# Patient Record
Sex: Female | Born: 1970
Health system: Southern US, Community
[De-identification: ages and names within clinical notes are randomized; demographics above are authoritative.]

## PROBLEM LIST (undated history)

## (undated) DIAGNOSIS — T7840XA Allergy, unspecified, initial encounter: Secondary | ICD-10-CM

## (undated) DIAGNOSIS — D649 Anemia, unspecified: Secondary | ICD-10-CM

## (undated) DIAGNOSIS — E282 Polycystic ovarian syndrome: Secondary | ICD-10-CM

## (undated) DIAGNOSIS — N189 Chronic kidney disease, unspecified: Secondary | ICD-10-CM

## (undated) DIAGNOSIS — I10 Essential (primary) hypertension: Secondary | ICD-10-CM

## (undated) DIAGNOSIS — Z8709 Personal history of other diseases of the respiratory system: Secondary | ICD-10-CM

## (undated) DIAGNOSIS — C801 Malignant (primary) neoplasm, unspecified: Secondary | ICD-10-CM

## (undated) DIAGNOSIS — E785 Hyperlipidemia, unspecified: Secondary | ICD-10-CM

## (undated) DIAGNOSIS — R7303 Prediabetes: Secondary | ICD-10-CM

## (undated) HISTORY — PX: NASAL SINUS SURGERY: SHX719

## (undated) HISTORY — DX: Essential (primary) hypertension: I10

## (undated) HISTORY — PX: REFRACTIVE SURGERY: SHX103

## (undated) HISTORY — PX: CERVICAL CERCLAGE: SHX1329

## (undated) HISTORY — PX: COLONOSCOPY: SHX174

## (undated) HISTORY — PX: DILATION AND CURETTAGE OF UTERUS: SHX78

## (undated) HISTORY — PX: UTERINE ARTERY EMBOLIZATION: SHX2629

## (undated) HISTORY — DX: Hyperlipidemia, unspecified: E78.5

## (undated) HISTORY — PX: OTHER SURGICAL HISTORY: SHX169

## (undated) HISTORY — DX: Polycystic ovarian syndrome: E28.2

---

## 1998-04-05 ENCOUNTER — Other Ambulatory Visit: Admission: RE | Admit: 1998-04-05 | Discharge: 1998-04-05 | Payer: Self-pay | Admitting: Obstetrics and Gynecology

## 1998-12-03 ENCOUNTER — Other Ambulatory Visit: Admission: RE | Admit: 1998-12-03 | Discharge: 1998-12-03 | Payer: Self-pay | Admitting: Obstetrics and Gynecology

## 1998-12-21 ENCOUNTER — Ambulatory Visit (HOSPITAL_COMMUNITY): Admission: RE | Admit: 1998-12-21 | Discharge: 1998-12-21 | Payer: Self-pay | Admitting: Obstetrics and Gynecology

## 1999-10-10 ENCOUNTER — Other Ambulatory Visit: Admission: RE | Admit: 1999-10-10 | Discharge: 1999-10-10 | Payer: Self-pay | Admitting: Obstetrics and Gynecology

## 1999-12-29 ENCOUNTER — Ambulatory Visit (HOSPITAL_COMMUNITY): Admission: RE | Admit: 1999-12-29 | Discharge: 1999-12-29 | Payer: Self-pay | Admitting: *Deleted

## 2000-03-17 ENCOUNTER — Inpatient Hospital Stay (HOSPITAL_COMMUNITY): Admission: AD | Admit: 2000-03-17 | Discharge: 2000-03-17 | Payer: Self-pay | Admitting: Obstetrics and Gynecology

## 2000-04-18 ENCOUNTER — Inpatient Hospital Stay (HOSPITAL_COMMUNITY): Admission: EM | Admit: 2000-04-18 | Discharge: 2000-04-18 | Payer: Self-pay | Admitting: Obstetrics and Gynecology

## 2000-04-18 ENCOUNTER — Encounter: Payer: Self-pay | Admitting: Obstetrics and Gynecology

## 2000-04-30 ENCOUNTER — Inpatient Hospital Stay (HOSPITAL_COMMUNITY): Admission: AD | Admit: 2000-04-30 | Discharge: 2000-05-02 | Payer: Self-pay | Admitting: Obstetrics and Gynecology

## 2000-05-03 ENCOUNTER — Encounter: Admission: RE | Admit: 2000-05-03 | Discharge: 2000-07-24 | Payer: Self-pay | Admitting: Obstetrics and Gynecology

## 2000-05-29 ENCOUNTER — Other Ambulatory Visit: Admission: RE | Admit: 2000-05-29 | Discharge: 2000-05-29 | Payer: Self-pay | Admitting: Obstetrics and Gynecology

## 2001-03-06 ENCOUNTER — Other Ambulatory Visit: Admission: RE | Admit: 2001-03-06 | Discharge: 2001-03-06 | Payer: Self-pay | Admitting: Obstetrics and Gynecology

## 2001-04-18 ENCOUNTER — Encounter: Admission: RE | Admit: 2001-04-18 | Discharge: 2001-07-17 | Payer: Self-pay | Admitting: Obstetrics and Gynecology

## 2001-09-07 ENCOUNTER — Inpatient Hospital Stay (HOSPITAL_COMMUNITY): Admission: AD | Admit: 2001-09-07 | Discharge: 2001-09-09 | Payer: Self-pay | Admitting: Obstetrics and Gynecology

## 2001-10-10 ENCOUNTER — Other Ambulatory Visit: Admission: RE | Admit: 2001-10-10 | Discharge: 2001-10-10 | Payer: Self-pay | Admitting: Obstetrics and Gynecology

## 2002-09-25 ENCOUNTER — Other Ambulatory Visit: Admission: RE | Admit: 2002-09-25 | Discharge: 2002-09-25 | Payer: Self-pay | Admitting: Obstetrics and Gynecology

## 2002-10-11 ENCOUNTER — Emergency Department (HOSPITAL_COMMUNITY): Admission: EM | Admit: 2002-10-11 | Discharge: 2002-10-11 | Payer: Self-pay | Admitting: *Deleted

## 2002-10-11 ENCOUNTER — Encounter: Payer: Self-pay | Admitting: Emergency Medicine

## 2003-11-25 ENCOUNTER — Other Ambulatory Visit: Admission: RE | Admit: 2003-11-25 | Discharge: 2003-11-25 | Payer: Self-pay | Admitting: Obstetrics and Gynecology

## 2005-01-06 ENCOUNTER — Ambulatory Visit: Payer: Self-pay | Admitting: Internal Medicine

## 2005-01-11 ENCOUNTER — Encounter: Admission: RE | Admit: 2005-01-11 | Discharge: 2005-04-11 | Payer: Self-pay | Admitting: Internal Medicine

## 2005-04-10 ENCOUNTER — Ambulatory Visit: Payer: Self-pay | Admitting: *Deleted

## 2005-06-07 ENCOUNTER — Encounter: Admission: RE | Admit: 2005-06-07 | Discharge: 2005-06-07 | Payer: Self-pay | Admitting: Family Medicine

## 2005-08-24 ENCOUNTER — Emergency Department (HOSPITAL_COMMUNITY): Admission: EM | Admit: 2005-08-24 | Discharge: 2005-08-24 | Payer: Self-pay | Admitting: Emergency Medicine

## 2005-08-24 ENCOUNTER — Ambulatory Visit: Payer: Self-pay | Admitting: Cardiology

## 2005-08-31 ENCOUNTER — Ambulatory Visit: Payer: Self-pay

## 2005-12-14 ENCOUNTER — Ambulatory Visit: Payer: Self-pay | Admitting: Internal Medicine

## 2007-01-22 ENCOUNTER — Encounter (INDEPENDENT_AMBULATORY_CARE_PROVIDER_SITE_OTHER): Payer: Self-pay | Admitting: Specialist

## 2007-01-22 ENCOUNTER — Ambulatory Visit (HOSPITAL_COMMUNITY): Admission: RE | Admit: 2007-01-22 | Discharge: 2007-01-22 | Payer: Self-pay | Admitting: Obstetrics and Gynecology

## 2007-04-04 ENCOUNTER — Encounter: Admission: RE | Admit: 2007-04-04 | Discharge: 2007-04-04 | Payer: Self-pay | Admitting: Obstetrics and Gynecology

## 2007-04-14 ENCOUNTER — Encounter: Admission: RE | Admit: 2007-04-14 | Discharge: 2007-04-14 | Payer: Self-pay | Admitting: Interventional Radiology

## 2007-05-14 ENCOUNTER — Ambulatory Visit (HOSPITAL_COMMUNITY): Admission: RE | Admit: 2007-05-14 | Discharge: 2007-05-14 | Payer: Self-pay | Admitting: Interventional Radiology

## 2007-05-16 ENCOUNTER — Ambulatory Visit (HOSPITAL_COMMUNITY): Admission: RE | Admit: 2007-05-16 | Discharge: 2007-05-17 | Payer: Self-pay | Admitting: Interventional Radiology

## 2007-05-29 ENCOUNTER — Encounter: Admission: RE | Admit: 2007-05-29 | Discharge: 2007-05-29 | Payer: Self-pay | Admitting: Interventional Radiology

## 2007-11-29 ENCOUNTER — Ambulatory Visit: Payer: Self-pay | Admitting: Oncology

## 2008-01-10 ENCOUNTER — Ambulatory Visit: Payer: Self-pay | Admitting: Oncology

## 2008-01-29 ENCOUNTER — Encounter: Admission: RE | Admit: 2008-01-29 | Discharge: 2008-01-29 | Payer: Self-pay | Admitting: Interventional Radiology

## 2011-05-12 NOTE — Consult Note (Signed)
[name], [name]                   ACCOUNT NO.:  [id number]   MEDICAL RECORD NO.:  [id number]          PATIENT TYPE:  EMS   LOCATION:  MINO                         FACILITY:  [facility]   PHYSICIAN:  Olga Millers, M.D. LHCDATE OF BIRTH:  04-29-1971   DATE OF CONSULTATION:  08/24/2005  DATE OF DISCHARGE:                                   CONSULTATION   [name] is a 40 year old female, a patient of Dr. Tenny Craw, who has a history of  hyperlipidemia, and now I am asked to evaluate for chest pain.  Of note, the  patient typically does not have dyspnea on exertion, orthopnea, PND, pedal  edema or exertional chest pain.  Yesterday at approximately 2 p.m. she  developed point tenderness in the left chest area.  It was described as a  sharp pain.  The pain did not radiate.  It did increase with turning her  head and with moving around, by her report.  There was no associated  shortness of breath, nausea or vomiting or diaphoresis.  The pain has been  essentially continuous since then, and she presented to the emergency room  this afternoon.  We were asked to further evaluate.   She has no known drug allergies.   SOCIAL HISTORY:  She does not smoke, nor does she consume alcohol.  She  denies any cocaine use.   FAMILY HISTORY:  Positive for coronary artery disease in her father, who has  had stents placed in his 85s.   PAST MEDICAL HISTORY:  Significant for hyperlipidemia, but there is no  diabetes mellitus or hypertension.  She has had a previous C-section.   Her present medications include:  1.  Vytorin 10/10 mg daily.  2.  Zyrtec.  3.  Birth control pills.  4.  She also takes herbal supplements.   REVIEW OF SYSTEMS:  She denies any headaches or fevers or chills.  There is  no productive cough or hemoptysis.  There is no dysphagia, odynophagia,  melena or hematochezia.  There is no dysuria or hematuria.  There is no rash  or seizure activity.  There is no orthopnea, PND or pedal edema.   The  remaining systems are negative.  Of note, she has not had any recent travel  or leg injury.   PHYSICAL EXAMINATION:  VITAL SIGNS:  Her blood pressure is 118/53 and her  pulse is 66.  She is afebrile.  GENERAL:  She is well-developed and well-nourished, in no acute distress.  Her skin is warm and dry.  She does not appear to be depressed, and there is  no peripheral clubbing.  HEENT:  Unremarkable with normal eyelids.  NECK:  Supple, within normal limits bilaterally, and there are no bruits  noted.  There is no jugular venous distention and no thyromegaly noted.  CHEST:  Clear to auscultation with normal expansion.  CARDIOVASCULAR:  Regular rate and rhythm, normal S1 and S2.  There are no  murmurs, rubs or gallops noted.  She is tender to palpation over the left  chest area and states that her pain  is reproduced with those measures.  ABDOMEN:  Not tender or distended, positive bowel sounds noted, no  hepatosplenomegaly and no masses appreciated.  There is no abdominal bruit.  She has 2+ femoral pulses bilaterally and no bruits.  EXTREMITIES:  No edema, and I can palpate no cords.  She has a negative  Homans bilaterally.  She has 2+ dorsalis pedis pulses bilaterally.  NEUROLOGIC:  Grossly intact.   Her electrocardiogram shows a normal sinus rhythm at a rate of 64.  The axis  is normal.  There are nonspecific ST changes.  Her urinalysis is negative.  Her point of care markers are negative x2.  Her potassium is 3.5 with a BUN  and creatinine of 9 and 0.7.  Her hemoglobin and hematocrit are 11.8 and  34.7, respectively.  Her white blood cell count is 5.9.   DIAGNOSES:  1.  Atypical chest pain.  2.  Hyperlipidemia.   PLAN:  [name] has presented with chest pain most consistent with  musculoskeletal pain.  It has been continuous for greater than 12 hours and  is reproduced on palpation.  Given the duration and the fact that her  enzymes are negative, then she is essentially ruled  out for myocardial  infarction.  I think it is therefore appropriate to discharge the patient,  and we will plan an outpatient Myoview.  She will need to try nonsteroidals  for her musculoskeletal pain, and she will follow up with Dr. Tenny Craw.           ______________________________  Olga Millers, M.D. [facility]     BC/MEDQ  D:  08/24/2005  T:  08/25/2005  Job:  [id number]

## 2011-05-12 NOTE — Op Note (Signed)
[facility]  Patient:    [name]                           MRN: [id number] Proc. Date: 12/29/99 Adm. Date:  [id number] Attending:  Ardeen Fillers                           Operative Report  PREOPERATIVE DIAGNOSIS:       Cervical incompetence.  Intrauterine pregnancy at 68+ weeks gestational age.  POSTOPERATIVE DIAGNOSIS:      Cervical incompetence.  Intrauterine pregnancy at 66+ weeks gestational age.  OPERATION:                    Modified Shirodkar cervical cerclage.  SURGEON:                      Sung Amabile. Roslyn Smiling, M.D.  ASSISTANT:                    Genia Del, M.D.  ANESTHESIA:                   Spinal.  ESTIMATED BLOOD LOSS:         Less than 10 cc.  TUBES AND DRAINS:             None.  COMPLICATIONS:                None.  FINDINGS:                     Preoperative cervical examination closed, 1 to 2 m long, soft, anterior.  Postoperative cervical examination closed, 2 to 3 cm long. Stitch knot at 1 oclock.  SPECIMENS:                    None.  INDICATIONS:                  A 40 year old woman, gravida 2, para 0-0-1-0, admitted at 57+ weeks gestational age for cervical cerclage because of progressive cervical change consistent with cervical incompetence which was first noted on ultrasound one week ago.  DESCRIPTION OF PROCEDURE:     After the establishment of spinal anesthesia, the  patient was placed in the left uterine displacement position.  The fetal heart tones were documented.  The patient was then placed in the dorsal lithotomy position.  The perineum and vagina were prepped with Betadine solution.  She was draped.  The bladder was evacuated with straight catheterization.  A weighted speculum was inserted into the vagina.  The cervix was reprepped with Betadine solution.  Narrow Deavers were used to gently retract the vaginal walls.  The cervix was grasped with small ring forceps.  Using the modified  Shirodkar technique, Mersilene band was placed in a pursestring fashion.  0 Prolene was tied into the Mersilene knot (which was at 1 oclock).  After the procedure, the cervix was closed and 2 to 3 cm long.  The bladder was evacuated again and urine was clear.  Rectal examination was performed and was normal.  She was returned to the supine position and transported to the recovery room in satisfactory condition. DD:  12/29/99 TD:  12/30/99 Job: [id number]

## 2011-05-12 NOTE — Op Note (Signed)
NAMECHAUNCEY, [name]                   ACCOUNT NO.:  [id number]   MEDICAL RECORD NO.:  [id number]          PATIENT TYPE:  AMB   LOCATION:  SDC                           FACILITY:  [facility]   PHYSICIAN:  Maxie Better, M.D.DATE OF BIRTH:  [birth year]   DATE OF PROCEDURE:  01/22/2007  DATE OF DISCHARGE:                               OPERATIVE REPORT   PREOPERATIVE DIAGNOSIS:  Left complex ovarian cyst, menorrhagia, uterine  fibroids.   PROCEDURE:  Diagnostic hysteroscopy, hysteroscopic resection of  submucosal fibroid, dilation and curettage, diagnostic laparoscopy, left  ovarian cystectomy.   POSTOPERATIVE DIAGNOSIS:  Left ovarian dermoid cyst, menorrhagia,  submucosal fibroid.   ANESTHESIA:  General, paracervical block.   SURGEON:  Maxie Better, M.D.   ASSISTANT:  None.   DESCRIPTION OF PROCEDURE:  Under adequate general anesthesia, the  patient was placed in the dorsal lithotomy position.  She was sterilely  prepped and draped in the usual fashion.  The bladder was catheterized  for a small amount of urine.  Examination under anesthesia revealed a  retroverted uterus.  No adnexal masses could be appreciated.  A bivalve  speculum was placed in the vagina.  A single tooth tenaculum was placed  on the anterior lip of the cervix. 1% Nesacaine was injected  paracervical at 3 and 9 o'clock. The cervix, which was parous, was  serially dilated up to number 21 Pratt dilator.  A diagnostic  hysteroscope was introduced. Both tubal ostia could be seen; however,  the distension of the cavity was limited by the parous cervix and,  therefore, the diagnostic hysteroscope was removed.  The cervix was then  further dilated and a resectoscope with a double loop was introduced  into the uterine cavity without incident.  On inspection of the uterine  cavity, there was noted on the right lateral sidewall a large submucosal  fibroid.  No other lesions could be noted.  Using a double loop, the  fibroid was resected and small pieces removed. Concern for continued  protuberance of the fibroid into the endometrial cavity resulted in the  procedure being terminated after what was felt to be adequate submucosal  resection. The resectoscope was removed.  The cavity was then curetted  and specimen labeled endometrial curettings and fibroid resections were  both passed off for pathology.  The bivalve acorn cannula was introduced  into the uterine cavity and attached to the tenaculum for manipulation  of the uterus.  The bivalve speculum was removed.   Attention was then turned to the abdomen.  An infraumbilical small  vertical incision was made after 0.25% Marcaine was injected at the  site.  A Veress needle was introduced.  Opening pressure of 9 was noted.  2.5 liters of CO2 was insufflated after normal saline had been used to  test the placement of the Veress needle.  The Veress needle was then  removed.  A 10 mm disposable trocar was introduced into the abdomen  without incident and the lighted video laparoscope was then introduced.  On initial inspection of the pelvis, there was blood in the abdomen  thought to be probably secondary to a comminution of the patient was  previously bleeding and from the procedure that had just been performed.  A suprapubic incision was then made, a 5 mm port was placed through that  suprapubic incision under direct visualization.  The probe was then  utilized to inspect the upper abdomen and pelvis.  The normal liver edge  was noted.  An elongated but otherwise normal appendix was noted.  The  uterus was noted to be bulky. Both fallopian tubes were normal.  The  right ovary was enlarged consistent with a polycystic type ovarian  ovary. The posterior cul-de-sac had some blood in the posterior aspect  of the cul-de-sac.  The left ovary was normal in size with a small area  of yellowish appearance that suggested cystic in nature, though possibly  to be a  corpus luteal cyst.  The left ovary was deemed stabilized and  the decision was made to try to open the overlying yellow area.   An initial attempt to through the suprapubic port was not feasible, and  therefore, a left lower quadrant incision was then made and a 5 mm port  was placed under direct visualization. Using a hot scissors, an incision  was made overlying the yellow at which time sebaceous material extruded  consistent with a dermoid cyst. Using a grasper, the cyst was opened  further and the sebaceous material was suctioned out and it was then  noted that there was another cystic mass beneath that and this was then  enucleated and ultimately removed.  The cyst wall was then also removed  off of the ovary and cauterization was used for hemostasis.  The left  ovarian dermoid cyst was then taken out through the infraumbilical port  and sent to pathology as a left ovarian dermoid cyst. The abdomen was  then copiously irrigated and suctioned posteriorly.  There was some  hemosiderin material suggestive of endometriosis in the posterior cul-de-  sac.  When good irrigation was noted and no further sebaceous material  was seen.  The procedure was felt to have been complete at which time  the lower ports were then removed, the abdomen was deflated, and the  infraumbilical port was removed under direct visualization. The  infraumbilical incision was closed with a deep layer 0 Vicryl figure-of-  eight suture.  The other incision sites and superficial areas were  closed with 4-0 Vicryl.  The instruments in the vagina were removed.  The cervix was inspected for any bleeders.  Specimens labeled  endometrial curetting, fibroid resection, and left ovarian dermoid cyst  with cyst wall was sent to pathology.  Estimated blood loss was minimal.  Complication was none.  The patient tolerated the procedure well and was  transferred to recovery in stable condition.     Maxie Better, M.D.   Electronically Signed     [place]/MEDQ  D:  01/22/2007  T:  01/22/2007  Job:  [id number]

## 2011-05-12 NOTE — H&P (Signed)
[facility]  Patient:    [name], [name] Visit Number: [id number] MRN: [id number]          Service Type: OBS Location: 910B [id number] 01 Attending Physician:  Esmeralda Arthur Dictated by:   Silverio Lay, M.D. Admit Date:  09/07/2001                           History and Physical  REASON FOR ADMISSION:         Intrauterine pregnancy, at 37 weeks and 4 days with spontaneous rupture of membranes.  HISTORY OF PRESENT ILLNESS:   This is a 40 year old, married, African-American woman, gravida 3, para 1, abortus 1, with a due date by ultrasound of September 24, 2001, being admitted at 37 weeks and 4 days reporting spontaneous rupture of membranes at around 0030 hours this morning.  Fluid was clear. She reported no bleeding, reported good fetal activity and denied any symptoms of pregnancy-induced hypertension. She came in contracting every 3-5 minutes.  PRENATAL COURSE:              Reveals blood type A positive, sickle cell negative RPR nonreactive, rubella-immune, HBsAg negative.  HIV negative. Pap smear within normal limits. Gonorrhea negative. Chlamydia negative.  First trimester ultrasound with a cervical length of 3.75 cm and due date changed to October 1.  A 16-week AFP within normal limits.  A 20-week ultrasound with posterior placenta, velamentous insertion of cord, normal anatomy survey, and cervical length at 4.01 cm.  A 28-week glucose tolerance test within normal limits, repeated at 33 weeks was elevated and needed a three-hour glucose tolerance test which was normal.  She has been on relative bed rest at home for preterm cervical change. A 34-week ultrasound with a estimated fetal weight at the 93rd percentile and amniotic fluid index within normal limits. A 38-week ultrasound with an estimated fetal week at 77th percentile.  Upper limit normal amniotic fluid index, and 35-week group B strep negative. Prenatal course was otherwise  uneventful.  PAST MEDICAL HISTORY:         Allergy to [facility], December 1999. Miscarriage requiring D&C, no complication.  May 2001 vaginal delivery by vacuum extraction at 38 weeks of a female infant weighing 7 pounds 15 ounces. Required a cerclage during that pregnancy.  FAMILY HISTORY:               Father with chronic hypertension and colorectal cancer at age 68.  SOCIAL HISTORY:               Married, non smoker, is a Art gallery manager.  PHYSICAL EXAMINATION:  VITAL SIGNS:                  Vital signs are normal.  HEENT:                        Negative.  LUNGS:                        Clear.  HEART:                        Normal.  ABDOMEN:                      Gravid and nontender, size greater than dates with mild polyhydramnios.  PELVIC:  Vaginal examination by admitting nurse, 3-4 cm, 80% effaced, vertex -1, positive fern.  Fetal heart rate trace is reactive.  ASSESSMENT:                   Intrauterine pregnancy, at 37 weeks and 4 days, with spontaneous rupture of membranes in early labor.  PLAN:                         The patient will be admitted to labor and delivery.  Pain management as usual. Spontaneous vaginal delivery expected. Dictated by:   Silverio Lay, M.D. Attending Physician:  Esmeralda Arthur DD:  09/07/01 TD:  09/07/01 Job: [id number]

## 2014-02-27 ENCOUNTER — Encounter: Payer: 59 | Attending: Obstetrics and Gynecology | Admitting: *Deleted

## 2014-02-27 ENCOUNTER — Encounter: Payer: Self-pay | Admitting: *Deleted

## 2014-02-27 VITALS — Wt 165.0 lb

## 2014-02-27 DIAGNOSIS — R7309 Other abnormal glucose: Secondary | ICD-10-CM | POA: Insufficient documentation

## 2014-02-27 DIAGNOSIS — Z713 Dietary counseling and surveillance: Secondary | ICD-10-CM | POA: Insufficient documentation

## 2014-02-27 DIAGNOSIS — E663 Overweight: Secondary | ICD-10-CM

## 2014-02-27 DIAGNOSIS — R739 Hyperglycemia, unspecified: Secondary | ICD-10-CM

## 2014-02-27 DIAGNOSIS — E282 Polycystic ovarian syndrome: Secondary | ICD-10-CM

## 2014-02-27 NOTE — Patient Instructions (Addendum)
Breakfast: Have protein with carbohydrates (boiled eggs, yogurt with fruit) Visualize the MyPlate method when making meals and choosing foods when you eat out Keep up the physical activity!

## 2014-02-27 NOTE — Progress Notes (Signed)
  Medical Nutrition Therapy:  Appt start time: 0800 end time:  0900.   Assessment:  Primary concerns today: [name] is here for nutrition counseling pertaining to weight loss and pre-diabetes. [name] has been having problems with her blood pressure being high. Her current weight is around  165 lb. Her doctor would like her to lose 30 lbs. [name] has previously tried diets like cutting out breads. [name] would like to "lose her stomach" but otherwise she is not concerned with her weight number. When she was in college, she was around 100 lbs. She states that she started gaining weight after her first pregnancy. Her highest weight has been 180 when she was first pregnant. She would like her weight to be around 150 lbs. She has a history of PCOS and she says that she craves sweets. [name] lives at home with her husband and two kids. She does the grocery  Shopping and the cooking. The family will eat together most of the time at the dining table with distractions (tv, phones). The family will eat out fairly often (examples: Chipotle, Pita Delite), [name] cooks 2-3 times a week. [name] states that she is a medium paced eater. She works at [employer] with a 8-5, M-F schedule. Her last HgbA1c was 5.5.  Preferred Learning Style:  No preference indicated   Learning Readiness:  Change in progress  MEDICATIONS: see list   DIETARY INTAKE:  Usual eating pattern includes 3 meals and 1-2 snacks per day.  24-hr recall:  B ( AM): banana, greek yogurt, apple, water  Snk ( AM): none  L ( PM): eats at work, salmon with broccoli and mac and cheese, chicken sandwich or [place] with fries, pork BBQ with hush puppies & slaw, water Snk ( PM): almonds D ( PM): leftovers, salads and a meat (chicken, [place], fish), water Snk ( PM): cookies, goldfish Beverages: water, sweet tea sometimes   Usual physical activity: 3 days a week [facility] [employer] , currently going on the treadmill (30  minutes)  Estimated energy needs: 2000 calories 225 g carbohydrates 150 g protein 56 g fat   Nutritional Diagnosis:  [place]-2.1 Inpaired nutrition utilization As related to carbohydrate metabolism .  As evidenced by PCOS.    Intervention:  Nutrition counseling provided. Discussed the MyPlate method, making half the plate non-starchy vegetables. Discussed carbohydrates and increasing fiber intake. Encouraged her to continue her physical activity regimen.  Teaching Method Utilized: Visual Auditory  Handouts given during visit include:  none  Barriers to learning/adherence to lifestyle change: none  Demonstrated degree of understanding via:  Teach Back   Monitoring/Evaluation:  Dietary intake, exercise, A1c, and body weight prn.

## 2017-01-29 DIAGNOSIS — Z1231 Encounter for screening mammogram for malignant neoplasm of breast: Secondary | ICD-10-CM | POA: Diagnosis not present

## 2017-02-21 DIAGNOSIS — L602 Onychogryphosis: Secondary | ICD-10-CM | POA: Diagnosis not present

## 2017-02-21 DIAGNOSIS — B351 Tinea unguium: Secondary | ICD-10-CM | POA: Diagnosis not present

## 2017-02-26 ENCOUNTER — Ambulatory Visit (HOSPITAL_COMMUNITY)
Admission: EM | Admit: 2017-02-26 | Discharge: 2017-02-26 | Disposition: A | Discharge status: Home / Self Care | Payer: 59 | Attending: Family Medicine | Admitting: Family Medicine

## 2017-02-26 ENCOUNTER — Emergency Department (HOSPITAL_COMMUNITY)
Admission: EM | Admit: 2017-02-26 | Discharge: 2017-02-26 | Disposition: A | Discharge status: Home / Self Care | Payer: 59 | Attending: Emergency Medicine | Admitting: Emergency Medicine

## 2017-02-26 ENCOUNTER — Encounter (HOSPITAL_COMMUNITY): Payer: Self-pay | Admitting: Family Medicine

## 2017-02-26 ENCOUNTER — Encounter (HOSPITAL_COMMUNITY): Payer: Self-pay | Admitting: Emergency Medicine

## 2017-02-26 ENCOUNTER — Emergency Department (HOSPITAL_COMMUNITY): Payer: 59

## 2017-02-26 DIAGNOSIS — I10 Essential (primary) hypertension: Secondary | ICD-10-CM | POA: Insufficient documentation

## 2017-02-26 DIAGNOSIS — R109 Unspecified abdominal pain: Secondary | ICD-10-CM | POA: Diagnosis not present

## 2017-02-26 DIAGNOSIS — R1013 Epigastric pain: Secondary | ICD-10-CM

## 2017-02-26 DIAGNOSIS — K8020 Calculus of gallbladder without cholecystitis without obstruction: Secondary | ICD-10-CM | POA: Diagnosis not present

## 2017-02-26 DIAGNOSIS — Z7984 Long term (current) use of oral hypoglycemic drugs: Secondary | ICD-10-CM | POA: Diagnosis not present

## 2017-02-26 DIAGNOSIS — K829 Disease of gallbladder, unspecified: Secondary | ICD-10-CM | POA: Diagnosis not present

## 2017-02-26 LAB — POC URINE PREG, ED: Preg Test, Ur: NEGATIVE

## 2017-02-26 LAB — URINALYSIS, ROUTINE W REFLEX MICROSCOPIC
Bilirubin Urine: NEGATIVE
Glucose, UA: NEGATIVE mg/dL
Hgb urine dipstick: NEGATIVE
Ketones, ur: NEGATIVE mg/dL
Leukocytes, UA: NEGATIVE
Nitrite: NEGATIVE
Protein, ur: NEGATIVE mg/dL
Specific Gravity, Urine: 1.019 (ref 1.005–1.030)
pH: 6 (ref 5.0–8.0)

## 2017-02-26 LAB — COMPREHENSIVE METABOLIC PANEL
ALT: 264 U/L — ABNORMAL HIGH (ref 14–54)
AST: 305 U/L — ABNORMAL HIGH (ref 15–41)
Albumin: 3.8 g/dL (ref 3.5–5.0)
Alkaline Phosphatase: 62 U/L (ref 38–126)
Anion gap: 10 (ref 5–15)
BUN: 14 mg/dL (ref 6–20)
CO2: 26 mmol/L (ref 22–32)
Calcium: 9.6 mg/dL (ref 8.9–10.3)
Chloride: 100 mmol/L — ABNORMAL LOW (ref 101–111)
Creatinine, Ser: 0.88 mg/dL (ref 0.44–1.00)
GFR calc Af Amer: >60 mL/min (ref >60)
GFR calc non Af Amer: >60 mL/min (ref >60)
Glucose, Bld: 98 mg/dL (ref 65–99)
Potassium: 3.2 mmol/L — ABNORMAL LOW (ref 3.5–5.1)
Sodium: 136 mmol/L (ref 135–145)
Total Bilirubin: 0.9 mg/dL (ref 0.3–1.2)
Total Protein: 6.8 g/dL (ref 6.5–8.1)

## 2017-02-26 LAB — CBC
HCT: 36 % (ref 36.0–46.0)
Hemoglobin: 11.6 g/dL — ABNORMAL LOW (ref 12.0–15.0)
MCH: 26.9 pg (ref 26.0–34.0)
MCHC: 32.2 g/dL (ref 30.0–36.0)
MCV: 83.5 fL (ref 78.0–100.0)
Platelets: 275 10*3/uL (ref 150–400)
RBC: 4.31 MIL/uL (ref 3.87–5.11)
RDW: 13.4 % (ref 11.5–15.5)
WBC: 6.1 10*3/uL (ref 4.0–10.5)

## 2017-02-26 LAB — LIPASE, BLOOD: Lipase: 33 U/L (ref 11–51)

## 2017-02-26 MED ORDER — POTASSIUM CHLORIDE CRYS ER 20 MEQ PO TBCR
40.0000 meq | EXTENDED_RELEASE_TABLET | Freq: Once | ORAL | Status: AC
  Administered 2017-02-26: 40 meq via ORAL
  Filled 2017-02-26: qty 2

## 2017-02-26 NOTE — ED Triage Notes (Signed)
Pt sts upper abd pain into shoulders worse after eating starting yesterday

## 2017-02-26 NOTE — ED Provider Notes (Signed)
Plains of epigastric pain onset yesterday pain is triggered by eating. She had chicken wings today at 1 PM which caused a flareup of pain. On exam she appears in no distress abdomen is obese, tender at right upper quadrant without guarding. Negative Murphy sign no right lower quadrant pain   Julia Dakin, MD 02/26/17 [id number]

## 2017-02-26 NOTE — ED Provider Notes (Signed)
[place]    CSN: [id number] Arrival date & time: 02/26/17  1649     History   Chief Complaint Chief Complaint  Patient presents with  . Abdominal Pain    HPI [name] is a 46 y.o. female.   Pt here for epigastric pain that started last night and radiation to shoulder. sts worse after eating a meal and relieved with ibuprofen. The pain lasted several hours last night but subsided for bedtime. When she got this morning pain was gone and she had something to eat the pain came back after eating. She's had very little to eat this afternoon other than a couple saltines at 3:00.  Patient's had no nausea vomiting or diarrhea. She's had no fever. She's never had this pain before. The pain is now gone, but was present in the epigastrium.      Past Medical History:  Diagnosis Date  . Hyperlipidemia   . Hypertension   . PCOS (polycystic ovarian syndrome)     There are no active problems to display for this patient.   History reviewed. No pertinent surgical history.  OB History    No data available       Home Medications    Prior to Admission medications   Medication Sig Start Date End Date Taking? Authorizing Provider  hydrochlorothiazide (HYDRODIURIL) 12.5 MG tablet Take 12.5 mg by mouth daily.   Yes Historical Provider, MD  fluticasone (FLONASE) 50 MCG/ACT nasal spray Place 2 sprays into both nostrils daily.    Historical Provider, MD  metFORMIN (GLUCOPHAGE) 500 MG tablet Take 500 mg by mouth 2 (two) times daily with a meal.    Historical Provider, MD    Family History Family History  Problem Relation Age of Onset  . Diabetes Father   . Hypertension Father   . Cancer Father   . Heart disease Father   . Cancer Other   . Sleep apnea Other     Social History Social History  Substance Use Topics  . Smoking status: Never Smoker  . Smokeless tobacco: Never Used  . Alcohol use Not on file     Allergies   Patient has no known  allergies.   Review of Systems Review of Systems  Constitutional: Negative.   HENT: Negative.   Gastrointestinal: Positive for abdominal pain.  Neurological: Negative.      Physical Exam Triage Vital Signs ED Triage Vitals  Enc Vitals Group     BP 02/26/17 1709 135/84     Pulse Rate 02/26/17 1709 78     Resp 02/26/17 1709 18     Temp 02/26/17 1709 98.2 F (36.8 C)     Temp src --      SpO2 02/26/17 1709 97 %     Weight --      Height --      Head Circumference --      Peak Flow --      Pain Score 02/26/17 1711 2     Pain Loc --      Pain Edu? --      Excl. in [place]? --    No data found.   Updated Vital Signs BP 135/84   Pulse 78   Temp 98.2 F (36.8 C)   Resp 18   LMP 02/12/2017   SpO2 97%   Visual Acuity Right Eye Distance:   Left Eye Distance:   Bilateral Distance:    Right Eye Near:   Left Eye Near:  Bilateral Near:     Physical Exam  Constitutional: She is oriented to person, place, and time. She appears well-developed and well-nourished.  HENT:  Head: Normocephalic.  Right Ear: External ear normal.  Left Ear: External ear normal.  Mouth/Throat: Oropharynx is clear and moist.  Eyes: Conjunctivae and EOM are normal. Pupils are equal, round, and reactive to light.  Neck: Normal range of motion. Neck supple.  Cardiovascular: Normal rate, regular rhythm and normal heart sounds.   Pulmonary/Chest: Breath sounds normal.  Abdominal: Soft. Bowel sounds are normal. She exhibits no distension. There is no tenderness. There is no rebound and no guarding.  Musculoskeletal: Normal range of motion.  Neurological: She is alert and oriented to person, place, and time.  Skin: Skin is warm and dry.  Nursing note and vitals reviewed.    UC Treatments / Results  Labs (all labs ordered are listed, but only abnormal results are displayed) Labs Reviewed - No data to display  EKG  EKG Interpretation None       Radiology No results  found.  Procedures Procedures (including critical care time)  Medications Ordered in UC Medications - No data to display   Initial Impression / Assessment and Plan / UC Course  I have reviewed the triage vital signs and the nursing notes.  Pertinent labs & imaging results that were available during my care of the patient were reviewed by me and considered in my medical decision making (see chart for details).     Final Clinical Impressions(s) / UC Diagnoses   Final diagnoses:  Acute epigastric pain    New Prescriptions New Prescriptions   No medications on file  Transferred to emergency department for further evaluation   Robyn Haber, MD 02/26/17 1728

## 2017-02-26 NOTE — ED Notes (Signed)
Pt returned from US

## 2017-02-26 NOTE — ED Notes (Signed)
Pt verbalized discharge instructions. Unable to sign.

## 2017-02-26 NOTE — ED Provider Notes (Signed)
[facility] DEPT Provider Note   CSN: [id number] Arrival date & time: 02/26/17  1736     History   Chief Complaint Chief Complaint  Patient presents with  . Abdominal Pain    HPI [name] is a 46 y.o. female.  HPI Arrives from urgent care due to concerns of abdominal pain that started yesterday. Patient states that she was eating and suddenly developed severe epigastric pain that radiated to her shoulder. She took some ibuprofen and felt better and then this morning was feeling a little bit of abdominal pain so she didn't eat much. But by the midday she was feeling better and ate some chicken wings which cause significant epigastric and right upper quadrant pain that radiated to her shoulder. She has not been vomiting. No diarrhea or constipation. No history of any abdominal surgeries. She is not pregnant to her knowledge. She is otherwise healthy except for hypertension and PCOS. She states the pain is severe but is significantly better controlled currently.  Past Medical History:  Diagnosis Date  . Hyperlipidemia   . Hypertension   . PCOS (polycystic ovarian syndrome)     There are no active problems to display for this patient.   History reviewed. No pertinent surgical history.  OB History    No data available       Home Medications    Prior to Admission medications   Medication Sig Start Date End Date Taking? Authorizing Provider  fluticasone (FLONASE) 50 MCG/ACT nasal spray Place 2 sprays into both nostrils daily.    Historical Provider, MD  hydrochlorothiazide (HYDRODIURIL) 12.5 MG tablet Take 12.5 mg by mouth daily.    Historical Provider, MD  metFORMIN (GLUCOPHAGE) 500 MG tablet Take 500 mg by mouth 2 (two) times daily with a meal.    Historical Provider, MD    Family History Family History  Problem Relation Age of Onset  . Diabetes Father   . Hypertension Father   . Cancer Father   . Heart disease Father   . Cancer Other   . Sleep apnea Other      Social History Social History  Substance Use Topics  . Smoking status: Never Smoker  . Smokeless tobacco: Never Used  . Alcohol use Not on file     Allergies   Patient has no known allergies.   Review of Systems Review of Systems  Constitutional: Negative for fever.  Allergic/Immunologic: Negative for immunocompromised state.  All other systems reviewed and are negative.    Physical Exam Updated Vital Signs BP 144/98 (BP Location: Right Arm)   Pulse 94   Temp 98.8 F (37.1 C) (Oral)   Resp 18   LMP 02/12/2017   SpO2 99%   Physical Exam  Constitutional: She is oriented to person, place, and time. She appears well-developed and well-nourished. No distress.  HENT:  Head: Normocephalic and atraumatic.  Eyes: Conjunctivae are normal. Right eye exhibits no discharge. Left eye exhibits no discharge.  Neck: Normal range of motion. Neck supple.  Cardiovascular: Normal rate and regular rhythm.   Pulmonary/Chest: Effort normal and breath sounds normal. No respiratory distress.  Abdominal: Soft. Bowel sounds are normal. She exhibits no distension and no mass. There is tenderness (epigastric, mild). There is no rebound and no guarding. No hernia.  Neg murphys sign  Musculoskeletal: She exhibits no edema.  Neurological: She is alert and oriented to person, place, and time.  Skin: Skin is warm. No rash noted.  Psychiatric: She has a normal mood  and affect.  Nursing note and vitals reviewed.    ED Treatments / Results  Labs (all labs ordered are listed, but only abnormal results are displayed) Labs Reviewed  COMPREHENSIVE METABOLIC PANEL - Abnormal; Notable for the following:       Result Value   Potassium 3.2 (*)    Chloride 100 (*)    AST 305 (*)    ALT 264 (*)    All other components within normal limits  CBC - Abnormal; Notable for the following:    Hemoglobin 11.6 (*)    All other components within normal limits  URINALYSIS, ROUTINE W REFLEX MICROSCOPIC -  Abnormal; Notable for the following:    APPearance HAZY (*)    All other components within normal limits  LIPASE, BLOOD  POC URINE PREG, ED    EKG  EKG Interpretation  Date/Time:  Monday February 26 2017 17:47:53 EST Ventricular Rate:  70 PR Interval:  134 QRS Duration: 70 QT Interval:  378 QTC Calculation: 408 R Axis:   37 Text Interpretation:  Normal sinus rhythm Low voltage QRS Cannot rule out Anterior infarct , age undetermined Abnormal ECG Confirmed by Jeneen Rinks  MD, [place] ([id number]) on 02/26/2017 5:56:21 PM       Radiology US Abdomen Limited Ruq  Result Date: 02/26/2017 CLINICAL DATA:  Abdominal pain EXAM: US ABDOMEN LIMITED - RIGHT UPPER QUADRANT COMPARISON:  None. FINDINGS: Gallbladder: Gallbladder is contracted. There are shadowing stones in the gallbladder lumen measuring up to 1.8 cm. Negative sonographic Murphy's. Wall thickness upper normal at 3 mm Common bile duct: Diameter: Normal at 4 mm Liver: No focal lesion identified. Within normal limits in parenchymal echogenicity. IMPRESSION: Contracted gallbladder containing shadowing stones. Borderline wall thickness is likely related to gallbladder contraction. No biliary dilatation Electronically Signed   By: Donavan Foil M.D.   On: 02/26/2017 21:23    Procedures Procedures (including critical care time)  Medications Ordered in ED Medications  potassium chloride SA (K-DUR,KLOR-CON) CR tablet 40 mEq (40 mEq Oral Given 02/26/17 2128)     Initial Impression / Assessment and Plan / ED Course  I have reviewed the triage vital signs and the nursing notes.  Pertinent labs & imaging results that were available during my care of the patient were reviewed by me and considered in my medical decision making (see chart for details).     Patient has mild elevation in her liver panel but no elevated bilirubin, ultrasound with cholelithiasis but no cholecystitis or choledocholithiasis. Patient is well-appearing without a fever, currently  pain-free. Spoke with Dr. Greer Pickerel, from surgical service who will follow up with patient as an outpatient. Had extensive discussion with patient on importance of avoidance of fatty meals, and discussed return precautions including fevers, pain that does not resolve after a few hours, severe pain associated with intractable vomiting, or other concerning symptoms. Patient verbalized understanding and agreement with plan and was discharged in good condition.  Final Clinical Impressions(s) / ED Diagnoses   Final diagnoses:  Abdominal pain  Symptomatic cholelithiasis    New Prescriptions Discharge Medication List as of 02/26/2017 10:23 PM       Karma Greaser, MD 02/27/17 [id number]    Orlie Dakin, MD 03/01/17 1008

## 2017-02-26 NOTE — Discharge Instructions (Signed)
Return if pain does not resolve after approximately 3-4 hours, if fever develops, if you start vomiting non stop, if pain too severe to manage at home

## 2017-02-26 NOTE — ED Triage Notes (Signed)
Pt here for epigastric pain that started last night and radiation to shoulder. sts worse after eating a meal and relieved with ibuprofen.

## 2017-02-26 NOTE — Discharge Instructions (Signed)
To evaluate this pain further, you will need an ultrasound and some lab work which can only be done in the emergency department. Therefore I'm sending you down to the emergency department for further evaluation.

## 2017-03-01 ENCOUNTER — Ambulatory Visit: Payer: Self-pay | Admitting: Surgery

## 2017-03-01 DIAGNOSIS — K8010 Calculus of gallbladder with chronic cholecystitis without obstruction: Secondary | ICD-10-CM | POA: Diagnosis not present

## 2017-03-01 NOTE — H&P (Signed)
[name] 03/01/2017 9:08 AM Location: [facility] Patient #: [id number] DOB: 01-19-1971 Married / Language: English / Race: Black or African American Female  History of Present Illness (Julia Halley A. Kae Heller MD; 03/01/2017 9:23 AM) Patient words: This is a very nice and relatively healthy 46 year old woman who works as a Freight forwarder at [employer], who is referred from the ER for symptomatic cholelithiasis. She had never had any symptoms that she can recall until this past Sunday when she acutely developed first bilateral shoulder pain and then epigastric pain, which she describes as an intense pressure similar to gas pain. She did not have any nausea or emesis, and denies fevers. The pain resolved after some ibuprofen. Unfortunately recurred the following day which prompted her to go to the emergency department. There she was found to have an AST of 300 and ALT of 260, but otherwise completely normal labs. Her gallbladder ultrasound revealed a contracted gallbladder with multiple stones the largest of which measures 1.8 cm, although wall thickness 3 mm, common bile duct diameter 4 mm, normal-appearing liver. She has been sticking with a low-fat diet in order to minimize her chances of having recurrent pain, and is interested in having her gallbladder removed. He has not had any abdominal surgeries. She takes metformin for polycystic ovary syndrome and HCTZ for hypertension.  The patient is a 46 year old female.   Past Surgical History Benjiman Core, [place]; 03/01/2017 9:08 AM) Oral Surgery  Diagnostic Studies History Benjiman Core, [place]; 03/01/2017 9:08 AM) Colonoscopy within last year Mammogram within last year Pap Smear 1-5 years ago  Allergies Benjiman Core, [place]; 03/01/2017 9:10 AM) No Known Drug Allergies 03/01/2017  Medication History (Armen Eulas Post, CMA; 03/01/2017 9:11 AM) HydroCHLOROthiazide (12.5MG  Tablet, Oral) Active. MetFORMIN HCl (1000MG  Tablet, Oral)  Active. Loratadine (10MG  Tablet, Oral) Active. Medications Reconciled  Social History Benjiman Core, CMA; 03/01/2017 9:08 AM) Alcohol use Occasional alcohol use. Caffeine use Carbonated beverages, Tea. No drug use Tobacco use Never smoker.  Family History Benjiman Core, [place]; 03/01/2017 9:08 AM) Arthritis Mother. Colon Cancer Father. Diabetes Mellitus Father. Heart disease in female family member before age 75 Hypertension Father. Melanoma Father. Prostate Cancer Father. Rectal Cancer Father.  Pregnancy / Birth History Benjiman Core, [place]; 03/01/2017 9:08 AM) Age at menarche 47 years. Contraceptive History Oral contraceptives. Gravida 3 Length (months) of breastfeeding 3-6 Maternal age 48-30 Para 2 Regular periods  Other Problems (Armen Eulas Post, CMA; 03/01/2017 9:08 AM) Back Pain Cholelithiasis High blood pressure Hypercholesterolemia     Review of Systems (Armen Glenn CMA; 03/01/2017 9:09 AM) General Present- Fatigue and Weight Gain. Not Present- Appetite Loss, Chills, Fever, Night Sweats and Weight Loss. Skin Not Present- Change in Wart/Mole, Dryness, Hives, Jaundice, New Lesions, Non-Healing Wounds, Rash and Ulcer. HEENT Present- Seasonal Allergies. Not Present- Earache, Hearing Loss, Hoarseness, Nose Bleed, Oral Ulcers, Ringing in the Ears, Sinus Pain, Sore Throat, Visual Disturbances, Wears glasses/contact lenses and Yellow Eyes. Respiratory Present- Snoring. Not Present- Bloody sputum, Chronic Cough, Difficulty Breathing and Wheezing. Breast Not Present- Breast Mass, Breast Pain, Nipple Discharge and Skin Changes. Cardiovascular Not Present- Chest Pain, Difficulty Breathing Lying Down, Leg Cramps, Palpitations, Rapid Heart Rate, Shortness of Breath and Swelling of Extremities. Gastrointestinal Present- Abdominal Pain, Change in Bowel Habits and Constipation. Not Present- Bloating, Bloody Stool, Chronic diarrhea, Difficulty Swallowing, Excessive gas, Gets  full quickly at meals, Hemorrhoids, Indigestion, Nausea, Rectal Pain and Vomiting. Female Genitourinary Not Present- Frequency, Nocturia, Painful Urination, Pelvic Pain and Urgency. Musculoskeletal Present- Back Pain.  Not Present- Joint Pain, Joint Stiffness, Muscle Pain, Muscle Weakness and Swelling of Extremities. Psychiatric Not Present- Anxiety, Bipolar, Change in Sleep Pattern, Depression, Fearful and Frequent crying. Endocrine Not Present- Cold Intolerance, Excessive Hunger, Hair Changes, Heat Intolerance, Hot flashes and New Diabetes. Hematology Not Present- Blood Thinners, Easy Bruising, Excessive bleeding, Gland problems, HIV and Persistent Infections.  Vitals (Armen Glenn CMA; 03/01/2017 9:09 AM) 03/01/2017 9:09 AM Weight: 167.38 lb Height: 64in Body Surface Area: 1.81 m Body Mass Index: 28.73 kg/m  Temp.: 99.63F  Pulse: 88 (Regular)  P.OX: 94% (Room air) BP: 130/78 (Sitting, Left Arm, Standard)      Physical Exam (Carliss Porcaro A. Kae Heller MD; 03/01/2017 9:24 AM)  General Note: Alert and oriented, well-appearing  Integumentary Note: No lesions or rashes on limited skin exam  Head and Neck Note: No mass or thyromegaly  Eye Note: Anicteric. Extraocular motion intact.  ENMT Note: Moist because membranes, good dentition  Chest and Lung Exam Note: Unlabored respirations, symmetrical air entry  Cardiovascular Note: Regular rate and rhythm, no pedal edema  Abdomen Note: Soft, nontender, nondistended. No mass or organomegaly. Currently no epigastric or right upper quadrant tenderness.  Neurologic Note: Grossly intact, normal gait  Neuropsychiatric Note: Normal mood and affect, appropriate insight  Musculoskeletal Note: Strength symmetrical throughout, no deformity    Assessment & Plan (Corry Storie A. Sherald Balbuena MD; 03/01/2017 9:25 AM)  CHRONIC CHOLECYSTITIS WITH CALCULUS (K80.10) Story: Although her symptoms have been a relatively short duration, and her  ultrasound has the appearance of chronic cholecystitis. We discussed laparoscopic cholecystectomy. I counseled her as to the risks of surgery including bleeding, infection, pain, scarring, injury to the common bile duct and sequelae, conversion to open surgery. We discussed the expected recovery. She asked appropriate questions and is interested in proceeding with surgery which we will schedule at her earliest convenience. In the meantime I recommended that she continue with a low-fat diet and we reviewed signs and symptoms which should prompt her to return to the emergency room.

## 2017-03-12 DIAGNOSIS — R7303 Prediabetes: Secondary | ICD-10-CM | POA: Diagnosis not present

## 2017-03-12 DIAGNOSIS — I10 Essential (primary) hypertension: Secondary | ICD-10-CM | POA: Diagnosis not present

## 2017-03-12 DIAGNOSIS — E785 Hyperlipidemia, unspecified: Secondary | ICD-10-CM | POA: Diagnosis not present

## 2017-03-19 DIAGNOSIS — Z01419 Encounter for gynecological examination (general) (routine) without abnormal findings: Secondary | ICD-10-CM | POA: Diagnosis not present

## 2017-04-05 NOTE — Pre-Procedure Instructions (Signed)
[name]  04/05/2017      [id number] [address], [place] [address] Phone: [phone] Fax: [phone]    Your procedure is scheduled on Fri, April 20 @ 7:30 AM  Report to [facility] Admitting at 5:30 AM  Call this number if you have problems the morning of surgery:  [phone]   Remember:  Do not eat food or drink liquids after midnight.  Take these medicines the morning of surgery with A SIP OF WATER Flonase(Fluticasone) and Claritin(Loratadine)              Stop taking Excedrin and Ibuprofen now. No Goody's,BC's,Aleve,Advil,Motrin,Herbal Medications, or Fish Oil.      How to Manage Your Diabetes Before and After Surgery  Why is it important to control my blood sugar before and after surgery? . Improving blood sugar levels before and after surgery helps healing and can limit problems. . A way of improving blood sugar control is eating a healthy diet by: o  Eating less sugar and carbohydrates o  Increasing activity/exercise o  Talking with your doctor about reaching your blood sugar goals . High blood sugars (greater than 180 mg/dL) can raise your risk of infections and slow your recovery, so you will need to focus on controlling your diabetes during the weeks before surgery. . Make sure that the doctor who takes care of your diabetes knows about your planned surgery including the date and location.  How do I manage my blood sugar before surgery? . Check your blood sugar at least 4 times a day, starting 2 days before surgery, to make sure that the level is not too high or low. o Check your blood sugar the morning of your surgery when you wake up and every 2 hours until you get to the Short Stay unit. . If your blood sugar is less than 70 mg/dL, you will need to treat for low blood sugar: o Do not take insulin. o Treat a low blood sugar (less than 70 mg/dL) with  cup of clear juice  (cranberry or apple), 4 glucose tablets, OR glucose gel. o Recheck blood sugar in 15 minutes after treatment (to make sure it is greater than 70 mg/dL). If your blood sugar is not greater than 70 mg/dL on recheck, call [phone] for further instructions. . Report your blood sugar to the short stay nurse when you get to Short Stay.  . If you are admitted to the hospital after surgery: o Your blood sugar will be checked by the staff and you will probably be given insulin after surgery (instead of oral diabetes medicines) to make sure you have good blood sugar levels. o The goal for blood sugar control after surgery is 80-180 mg/dL.              WHAT DO I DO ABOUT MY DIABETES MEDICATION?   Marland Kitchen Do not take oral diabetes medicines (pills) the morning of surgery.   . If your CBG is greater than 220 mg/dL, you may take  of your sliding scale (correction) dose of insulin. Reviewed and Endorsed by [facility] Patient Education Committee, August 2015  Do not wear jewelry, make-up or nail polish.  Do not wear lotions, powders,perfumes, or deoderant.  Do not shave 48 hours prior to surgery.    Do not bring valuables to the hospital.  [facility] is not responsible for any belongings or valuables.  Contacts,  dentures or bridgework may not be worn into surgery.  Leave your suitcase in the car.  After surgery it may be brought to your room.  For patients admitted to the hospital, discharge time will be determined by your treatment team.  Patients discharged the day of surgery will not be allowed to drive home.   [facility]  Before surgery, you can play an important role.  Because skin is not sterile, your skin needs to be as free of germs as possible.  You can reduce the number of germs on you skin by washing with CHG (chlorahexidine gluconate) soap before surgery.  CHG is an antiseptic cleaner which kills germs and bonds with the skin to continue  killing germs even after washing.  Please DO NOT use if you have an allergy to CHG or antibacterial soaps.  If your skin becomes reddened/irritated stop using the CHG and inform your nurse when you arrive at Short Stay.  Do not shave (including legs and underarms) for at least 48 hours prior to the first CHG shower.  You may shave your face.  Please follow these instructions carefully:   1.  Shower with CHG Soap the night before surgery and the                                morning of Surgery.  2.  If you choose to wash your hair, wash your hair first as usual with your       normal shampoo.  3.  After you shampoo, rinse your hair and body thoroughly to remove the                      Shampoo.  4.  Use CHG as you would any other liquid soap.  You can apply chg directly       to the skin and wash gently with scrungie or a clean washcloth.  5.  Apply the CHG Soap to your body ONLY FROM THE NECK DOWN.        Do not use on open wounds or open sores.  Avoid contact with your eyes,       ears, mouth and genitals (private parts).  Wash genitals (private parts)       with your normal soap.  6.  Wash thoroughly, paying special attention to the area where your surgery        will be performed.  7.  Thoroughly rinse your body with warm water from the neck down.  8.  DO NOT shower/wash with your normal soap after using and rinsing off       the CHG Soap.  9.  Pat yourself dry with a clean towel.            10.  Wear clean pajamas.            11.  Place clean sheets on your bed the night of your first shower and do not        sleep with pets.  Day of Surgery  Do not apply any lotions/deoderants the morning of surgery.  Please wear clean clothes to the hospital/surgery center.    Please read over the following fact sheets that you were given. Pain Booklet, Coughing and Deep Breathing, MRSA Information and Surgical Site Infection Prevention

## 2017-04-06 ENCOUNTER — Encounter (HOSPITAL_COMMUNITY)
Admission: RE | Admit: 2017-04-06 | Discharge: 2017-04-06 | Disposition: A | Discharge status: Home / Self Care | Payer: 59 | Source: Ambulatory Visit | Attending: Surgery | Admitting: Surgery

## 2017-04-06 ENCOUNTER — Encounter (HOSPITAL_COMMUNITY): Payer: Self-pay

## 2017-04-06 DIAGNOSIS — Z01812 Encounter for preprocedural laboratory examination: Secondary | ICD-10-CM | POA: Insufficient documentation

## 2017-04-06 HISTORY — DX: Allergy, unspecified, initial encounter: T78.40XA

## 2017-04-06 HISTORY — DX: Personal history of other diseases of the respiratory system: Z87.09

## 2017-04-06 LAB — BASIC METABOLIC PANEL
Anion gap: 9 (ref 5–15)
BUN: 11 mg/dL (ref 6–20)
CO2: 25 mmol/L (ref 22–32)
Calcium: 9.3 mg/dL (ref 8.9–10.3)
Chloride: 104 mmol/L (ref 101–111)
Creatinine, Ser: 0.69 mg/dL (ref 0.44–1.00)
GFR calc Af Amer: >60 mL/min (ref >60)
GFR calc non Af Amer: >60 mL/min (ref >60)
Glucose, Bld: 89 mg/dL (ref 65–99)
Potassium: 3.3 mmol/L — ABNORMAL LOW (ref 3.5–5.1)
Sodium: 138 mmol/L (ref 135–145)

## 2017-04-06 LAB — CBC
HCT: 35.4 % — ABNORMAL LOW (ref 36.0–46.0)
Hemoglobin: 11.3 g/dL — ABNORMAL LOW (ref 12.0–15.0)
MCH: 26.8 pg (ref 26.0–34.0)
MCHC: 31.9 g/dL (ref 30.0–36.0)
MCV: 83.9 fL (ref 78.0–100.0)
Platelets: 295 10*3/uL (ref 150–400)
RBC: 4.22 MIL/uL (ref 3.87–5.11)
RDW: 13.4 % (ref 11.5–15.5)
WBC: 4.2 10*3/uL (ref 4.0–10.5)

## 2017-04-06 LAB — HCG, SERUM, QUALITATIVE: Preg, Serum: NEGATIVE

## 2017-04-06 MED ORDER — CHLORHEXIDINE GLUCONATE 4 % EX LIQD: 60.0000 mL | Freq: Once | CUTANEOUS | Status: DC

## 2017-04-06 NOTE — Pre-Procedure Instructions (Signed)
[name]  04/06/2017      [id number] [address], [place] [address] Phone: [phone] Fax: [phone]    Your procedure is scheduled on Fri, April 20 @ 7:30 AM  Report to [facility] Admitting at 5:30 AM  Call this number if you have problems the morning of surgery:  [phone]   Remember:  Do not eat food or drink liquids after midnight.  Take these medicines the morning of surgery with A SIP OF WATER Flonase(Fluticasone) and Claritin(Loratadine)              Stop taking Ibuprofen along with Excedrin Migraine now. No Goody's,BC's,Aleve,Advil,Motrin,Fish Oil,or any Herbal Medications.      How to Manage Your Diabetes Before and After Surgery  Why is it important to control my blood sugar before and after surgery? . Improving blood sugar levels before and after surgery helps healing and can limit problems. . A way of improving blood sugar control is eating a healthy diet by: o  Eating less sugar and carbohydrates o  Increasing activity/exercise o  Talking with your doctor about reaching your blood sugar goals . High blood sugars (greater than 180 mg/dL) can raise your risk of infections and slow your recovery, so you will need to focus on controlling your diabetes during the weeks before surgery. . Make sure that the doctor who takes care of your diabetes knows about your planned surgery including the date and location.  How do I manage my blood sugar before surgery? . Check your blood sugar at least 4 times a day, starting 2 days before surgery, to make sure that the level is not too high or low. o Check your blood sugar the morning of your surgery when you wake up and every 2 hours until you get to the Short Stay unit. . If your blood sugar is less than 70 mg/dL, you will need to treat for low blood sugar: o Do not take insulin. o Treat a low blood sugar (less than 70 mg/dL) with  cup of  clear juice (cranberry or apple), 4 glucose tablets, OR glucose gel. o Recheck blood sugar in 15 minutes after treatment (to make sure it is greater than 70 mg/dL). If your blood sugar is not greater than 70 mg/dL on recheck, call [phone] for further instructions. . Report your blood sugar to the short stay nurse when you get to Short Stay.  . If you are admitted to the hospital after surgery: o Your blood sugar will be checked by the staff and you will probably be given insulin after surgery (instead of oral diabetes medicines) to make sure you have good blood sugar levels. o The goal for blood sugar control after surgery is 80-180 mg/dL.              WHAT DO I DO ABOUT MY DIABETES MEDICATION?   Marland Kitchen Do not take oral diabetes medicines (pills) the morning of surgery.    Reviewed and Endorsed by [facility] Patient Education Committee, August 2015   Do not wear jewelry, make-up or nail polish.  Do not wear lotions, powders, or perfumes, or deoderant.  Do not shave 48 hours prior to surgery.  Men may shave face and neck.  Do not bring valuables to the hospital.  [facility] is not responsible for any belongings or valuables.  Contacts, dentures or bridgework may not be worn into surgery.  Leave your suitcase in the car.  After surgery it may be brought to your room.  For patients admitted to the hospital, discharge time will be determined by your treatment team.  Patients discharged the day of surgery will not be allowed to drive home.    [facility]  Before surgery, you can play an important role.  Because skin is not sterile, your skin needs to be as free of germs as possible.  You can reduce the number of germs on you skin by washing with CHG (chlorahexidine gluconate) soap before surgery.  CHG is an antiseptic cleaner which kills germs and bonds with the skin to continue killing germs even after washing.  Please DO NOT use if  you have an allergy to CHG or antibacterial soaps.  If your skin becomes reddened/irritated stop using the CHG and inform your nurse when you arrive at Short Stay.  Do not shave (including legs and underarms) for at least 48 hours prior to the first CHG shower.  You may shave your face.  Please follow these instructions carefully:   1.  Shower with CHG Soap the night before surgery and the                                morning of Surgery.  2.  If you choose to wash your hair, wash your hair first as usual with your       normal shampoo.  3.  After you shampoo, rinse your hair and body thoroughly to remove the                      Shampoo.  4.  Use CHG as you would any other liquid soap.  You can apply chg directly       to the skin and wash gently with scrungie or a clean washcloth.  5.  Apply the CHG Soap to your body ONLY FROM THE NECK DOWN.        Do not use on open wounds or open sores.  Avoid contact with your eyes,       ears, mouth and genitals (private parts).  Wash genitals (private parts)       with your normal soap.  6.  Wash thoroughly, paying special attention to the area where your surgery        will be performed.  7.  Thoroughly rinse your body with warm water from the neck down.  8.  DO NOT shower/wash with your normal soap after using and rinsing off       the CHG Soap.  9.  Pat yourself dry with a clean towel.            10.  Wear clean pajamas.            11.  Place clean sheets on your bed the night of your first shower and do not        sleep with pets.  Day of Surgery  Do not apply any lotions/deoderants the morning of surgery.  Please wear clean clothes to the hospital/surgery center.    Please read over the following fact sheets that you were given. Pain Booklet, Coughing and Deep Breathing and Surgical Site Infection Prevention

## 2017-04-06 NOTE — Progress Notes (Signed)
Cardiologist denies  Medical Md is Dr.Elaine Laurann Montana  Echo > 10 yrs ago  Stress test > 10 yrs ago  Heart cath denies  EKG in epic from 02-26-17  CXR denies in past yr

## 2017-04-07 LAB — HEMOGLOBIN A1C
Hgb A1c MFr Bld: 5.6 % (ref 4.8–5.6)
Mean Plasma Glucose: 114 mg/dL

## 2017-04-12 MED ORDER — CEFAZOLIN SODIUM-DEXTROSE 2-4 GM/100ML-% IV SOLN
2.0000 g | INTRAVENOUS | Status: AC
  Administered 2017-04-13: 2 g via INTRAVENOUS
  Filled 2017-04-12: qty 100

## 2017-04-12 MED ORDER — GABAPENTIN 300 MG PO CAPS
300.0000 mg | ORAL_CAPSULE | ORAL | Status: AC
  Administered 2017-04-13: 300 mg via ORAL
  Filled 2017-04-12: qty 1

## 2017-04-12 MED ORDER — CELECOXIB 200 MG PO CAPS
400.0000 mg | ORAL_CAPSULE | ORAL | Status: AC
  Administered 2017-04-13: 400 mg via ORAL
  Filled 2017-04-12: qty 2

## 2017-04-13 ENCOUNTER — Ambulatory Visit (HOSPITAL_COMMUNITY): Payer: 59 | Admitting: Anesthesiology

## 2017-04-13 ENCOUNTER — Ambulatory Visit (HOSPITAL_COMMUNITY)
Admission: RE | Admit: 2017-04-13 | Discharge: 2017-04-13 | Disposition: A | Discharge status: Home / Self Care | Payer: 59 | Source: Ambulatory Visit | Attending: Surgery | Admitting: Surgery

## 2017-04-13 ENCOUNTER — Encounter (HOSPITAL_COMMUNITY)
Admission: RE | Disposition: A | Discharge status: Home / Self Care | Payer: Self-pay | Source: Ambulatory Visit | Attending: Surgery

## 2017-04-13 ENCOUNTER — Encounter (HOSPITAL_COMMUNITY): Payer: Self-pay | Admitting: Certified Registered Nurse Anesthetist

## 2017-04-13 DIAGNOSIS — Z7984 Long term (current) use of oral hypoglycemic drugs: Secondary | ICD-10-CM | POA: Diagnosis not present

## 2017-04-13 DIAGNOSIS — Z79899 Other long term (current) drug therapy: Secondary | ICD-10-CM | POA: Insufficient documentation

## 2017-04-13 DIAGNOSIS — E282 Polycystic ovarian syndrome: Secondary | ICD-10-CM | POA: Insufficient documentation

## 2017-04-13 DIAGNOSIS — I10 Essential (primary) hypertension: Secondary | ICD-10-CM | POA: Insufficient documentation

## 2017-04-13 DIAGNOSIS — K811 Chronic cholecystitis: Secondary | ICD-10-CM | POA: Diagnosis not present

## 2017-04-13 DIAGNOSIS — K8010 Calculus of gallbladder with chronic cholecystitis without obstruction: Secondary | ICD-10-CM | POA: Insufficient documentation

## 2017-04-13 HISTORY — PX: CHOLECYSTECTOMY: SHX55

## 2017-04-13 SURGERY — LAPAROSCOPIC CHOLECYSTECTOMY
Anesthesia: General | Site: Abdomen

## 2017-04-13 MED ORDER — LIDOCAINE HCL (CARDIAC) 20 MG/ML IV SOLN
INTRAVENOUS | Status: DC | PRN
  Administered 2017-04-13: 80 mg via INTRAVENOUS

## 2017-04-13 MED ORDER — SODIUM CHLORIDE 0.9 % IR SOLN
Status: DC | PRN
  Administered 2017-04-13: 1000 mL

## 2017-04-13 MED ORDER — LIDOCAINE 2% (20 MG/ML) 5 ML SYRINGE
INTRAMUSCULAR | Status: AC
  Filled 2017-04-13: qty 5

## 2017-04-13 MED ORDER — ARTIFICIAL TEARS OP OINT
TOPICAL_OINTMENT | OPHTHALMIC | Status: AC
  Filled 2017-04-13: qty 3.5

## 2017-04-13 MED ORDER — SUGAMMADEX SODIUM 200 MG/2ML IV SOLN
INTRAVENOUS | Status: AC
  Filled 2017-04-13: qty 2

## 2017-04-13 MED ORDER — BUPIVACAINE HCL (PF) 0.25 % IJ SOLN
INTRAMUSCULAR | Status: AC
  Filled 2017-04-13: qty 30

## 2017-04-13 MED ORDER — ONDANSETRON HCL 4 MG/2ML IJ SOLN
INTRAMUSCULAR | Status: AC
  Filled 2017-04-13: qty 2

## 2017-04-13 MED ORDER — EPHEDRINE SULFATE 50 MG/ML IJ SOLN
INTRAMUSCULAR | Status: DC | PRN
  Administered 2017-04-13: 5 mg via INTRAVENOUS

## 2017-04-13 MED ORDER — MIDAZOLAM HCL 2 MG/2ML IJ SOLN
INTRAMUSCULAR | Status: AC
  Filled 2017-04-13: qty 2

## 2017-04-13 MED ORDER — OXYCODONE-ACETAMINOPHEN 5-325 MG PO TABS: 1.0000 | ORAL_TABLET | Freq: Four times a day (QID) | ORAL | 0 refills | Status: DC | PRN

## 2017-04-13 MED ORDER — 0.9 % SODIUM CHLORIDE (POUR BTL) OPTIME
TOPICAL | Status: DC | PRN
  Administered 2017-04-13: 1000 mL

## 2017-04-13 MED ORDER — PROPOFOL 10 MG/ML IV BOLUS
INTRAVENOUS | Status: DC | PRN
  Administered 2017-04-13: 160 mg via INTRAVENOUS

## 2017-04-13 MED ORDER — ONDANSETRON HCL 4 MG/2ML IJ SOLN
INTRAMUSCULAR | Status: DC | PRN
  Administered 2017-04-13: 4 mg via INTRAVENOUS

## 2017-04-13 MED ORDER — BUPIVACAINE HCL (PF) 0.25 % IJ SOLN
INTRAMUSCULAR | Status: DC | PRN
  Administered 2017-04-13: 30 mL

## 2017-04-13 MED ORDER — DEXAMETHASONE SODIUM PHOSPHATE 10 MG/ML IJ SOLN
INTRAMUSCULAR | Status: AC
  Filled 2017-04-13: qty 1

## 2017-04-13 MED ORDER — DEXAMETHASONE SODIUM PHOSPHATE 10 MG/ML IJ SOLN
INTRAMUSCULAR | Status: DC | PRN
  Administered 2017-04-13: 5 mg via INTRAVENOUS

## 2017-04-13 MED ORDER — HYDROMORPHONE HCL 1 MG/ML IJ SOLN
0.2500 mg | INTRAMUSCULAR | Status: DC | PRN
  Administered 2017-04-13: 0.5 mg via INTRAVENOUS

## 2017-04-13 MED ORDER — HYDROMORPHONE HCL 1 MG/ML IJ SOLN
INTRAMUSCULAR | Status: AC
  Filled 2017-04-13: qty 0.5

## 2017-04-13 MED ORDER — SUGAMMADEX SODIUM 200 MG/2ML IV SOLN
INTRAVENOUS | Status: DC | PRN
  Administered 2017-04-13: 300 mg via INTRAVENOUS

## 2017-04-13 MED ORDER — FENTANYL CITRATE (PF) 100 MCG/2ML IJ SOLN
INTRAMUSCULAR | Status: DC | PRN
  Administered 2017-04-13 (×4): 50 ug via INTRAVENOUS

## 2017-04-13 MED ORDER — ROCURONIUM BROMIDE 100 MG/10ML IV SOLN
INTRAVENOUS | Status: DC | PRN
  Administered 2017-04-13: 50 mg via INTRAVENOUS

## 2017-04-13 MED ORDER — LACTATED RINGERS IV SOLN
INTRAVENOUS | Status: DC | PRN
  Administered 2017-04-13 (×2): via INTRAVENOUS

## 2017-04-13 MED ORDER — PHENYLEPHRINE HCL 10 MG/ML IJ SOLN
INTRAMUSCULAR | Status: DC | PRN
  Administered 2017-04-13: 40 ug via INTRAVENOUS

## 2017-04-13 MED ORDER — DOCUSATE SODIUM 100 MG PO CAPS: 100.0000 mg | ORAL_CAPSULE | Freq: Two times a day (BID) | ORAL | 0 refills | Status: AC

## 2017-04-13 MED ORDER — PROPOFOL 10 MG/ML IV BOLUS
INTRAVENOUS | Status: AC
  Filled 2017-04-13: qty 40

## 2017-04-13 MED ORDER — MIDAZOLAM HCL 5 MG/5ML IJ SOLN
INTRAMUSCULAR | Status: DC | PRN
  Administered 2017-04-13: 2 mg via INTRAVENOUS

## 2017-04-13 MED ORDER — ARTIFICIAL TEARS OP OINT
TOPICAL_OINTMENT | OPHTHALMIC | Status: DC | PRN
  Administered 2017-04-13: 1 via OPHTHALMIC

## 2017-04-13 MED ORDER — FENTANYL CITRATE (PF) 250 MCG/5ML IJ SOLN
INTRAMUSCULAR | Status: AC
  Filled 2017-04-13: qty 5

## 2017-04-13 MED ORDER — SUGAMMADEX SODIUM 500 MG/5ML IV SOLN
INTRAVENOUS | Status: AC
  Filled 2017-04-13: qty 5

## 2017-04-13 MED ORDER — PHENYLEPHRINE 40 MCG/ML (10ML) SYRINGE FOR IV PUSH (FOR BLOOD PRESSURE SUPPORT)
PREFILLED_SYRINGE | INTRAVENOUS | Status: AC
  Filled 2017-04-13: qty 10

## 2017-04-13 MED ORDER — ROCURONIUM BROMIDE 10 MG/ML (PF) SYRINGE
PREFILLED_SYRINGE | INTRAVENOUS | Status: AC
  Filled 2017-04-13: qty 5

## 2017-04-13 MED ORDER — EPHEDRINE 5 MG/ML INJ
INTRAVENOUS | Status: AC
  Filled 2017-04-13: qty 10

## 2017-04-13 MED ORDER — MEPERIDINE HCL 25 MG/ML IJ SOLN: 6.2500 mg | INTRAMUSCULAR | Status: DC | PRN

## 2017-04-13 MED ORDER — ACETAMINOPHEN 500 MG PO TABS
1000.0000 mg | ORAL_TABLET | ORAL | Status: AC
  Administered 2017-04-13: 1000 mg via ORAL
  Filled 2017-04-13: qty 2

## 2017-04-13 MED ORDER — PROMETHAZINE HCL 25 MG/ML IJ SOLN: 6.2500 mg | INTRAMUSCULAR | Status: DC | PRN

## 2017-04-13 SURGICAL SUPPLY — 34 items
ADH SKN CLS APL DERMABOND .7 (GAUZE/BANDAGES/DRESSINGS) ×1
APPLIER CLIP 5 13 M/L LIGAMAX5 (MISCELLANEOUS) ×2
APR CLP MED LRG 5 ANG JAW (MISCELLANEOUS) ×1
BAG SPEC RTRVL LRG 6X4 10 (ENDOMECHANICALS) ×1
CANISTER SUCT 3000ML PPV (MISCELLANEOUS) ×2 IMPLANT
CHLORAPREP W/TINT 26ML (MISCELLANEOUS) ×2 IMPLANT
CLIP APPLIE 5 13 M/L LIGAMAX5 (MISCELLANEOUS) ×1 IMPLANT
COVER SURGICAL LIGHT HANDLE (MISCELLANEOUS) ×2 IMPLANT
DERMABOND ADVANCED (GAUZE/BANDAGES/DRESSINGS) ×1
DERMABOND ADVANCED .7 DNX12 (GAUZE/BANDAGES/DRESSINGS) ×1 IMPLANT
ELECT REM PT RETURN 9FT ADLT (ELECTROSURGICAL) ×2
ELECTRODE REM PT RTRN 9FT ADLT (ELECTROSURGICAL) ×1 IMPLANT
GLOVE BIO SURGEON STRL SZ 6 (GLOVE) ×2 IMPLANT
GLOVE BIOGEL PI IND STRL 6.5 (GLOVE) ×1 IMPLANT
GLOVE BIOGEL PI INDICATOR 6.5 (GLOVE) ×1
GOWN STRL REUS W/ TWL LRG LVL3 (GOWN DISPOSABLE) ×3 IMPLANT
GOWN STRL REUS W/TWL LRG LVL3 (GOWN DISPOSABLE) ×6
GRASPER SUT TROCAR 14GX15 (MISCELLANEOUS) ×2 IMPLANT
KIT BASIN OR (CUSTOM PROCEDURE TRAY) ×2 IMPLANT
KIT ROOM TURNOVER OR (KITS) ×2 IMPLANT
NEEDLE INSUFFLATION 14GA 120MM (NEEDLE) ×2 IMPLANT
NS IRRIG 1000ML POUR BTL (IV SOLUTION) ×2 IMPLANT
PAD ARMBOARD 7.5X6 YLW CONV (MISCELLANEOUS) ×2 IMPLANT
POUCH SPECIMEN RETRIEVAL 10MM (ENDOMECHANICALS) ×2 IMPLANT
SCISSORS LAP 5X35 DISP (ENDOMECHANICALS) ×2 IMPLANT
SET IRRIG TUBING LAPAROSCOPIC (IRRIGATION / IRRIGATOR) ×2 IMPLANT
SLEEVE ENDOPATH XCEL 5M (ENDOMECHANICALS) ×4 IMPLANT
SPECIMEN JAR SMALL (MISCELLANEOUS) ×2 IMPLANT
SUT MNCRL AB 4-0 PS2 18 (SUTURE) ×2 IMPLANT
TOWEL OR 17X24 6PK STRL BLUE (TOWEL DISPOSABLE) ×2 IMPLANT
TRAY LAPAROSCOPIC MC (CUSTOM PROCEDURE TRAY) ×2 IMPLANT
TROCAR XCEL NON-BLD 11X100MML (ENDOMECHANICALS) ×2 IMPLANT
TROCAR XCEL NON-BLD 5MMX100MML (ENDOMECHANICALS) ×2 IMPLANT
TUBING INSUFFLATION (TUBING) ×2 IMPLANT

## 2017-04-13 NOTE — Anesthesia Postprocedure Evaluation (Signed)
Anesthesia Post Note  Patient: [name]  Procedure(s) Performed: Procedure(s) (LRB): LAPAROSCOPIC CHOLECYSTECTOMY (N/A)  Patient location during evaluation: PACU Anesthesia Type: General Level of consciousness: awake and alert Pain management: pain level controlled Vital Signs Assessment: post-procedure vital signs reviewed and stable Respiratory status: spontaneous breathing, nonlabored ventilation and respiratory function stable Cardiovascular status: blood pressure returned to baseline and stable Postop Assessment: no signs of nausea or vomiting Anesthetic complications: no       Last Vitals:  Vitals:   04/13/17 0910 04/13/17 0925  BP: 136/84 131/83  Pulse: 89 61  Resp: 15 16  Temp:      Last Pain:  Vitals:   04/13/17 0854  TempSrc:   PainSc: Asleep                 Lynda Rainwater

## 2017-04-13 NOTE — H&P (Signed)
[name] Patient #: [id number] DOB: 1971/01/08 Married / Language: English / Race: Black or African American Female  History of Present Illness  Patient words: This is a very nice and relatively healthy 46 year old woman who works as a Freight forwarder at [employer], who is referred from the ER for symptomatic cholelithiasis. She had never had any symptoms that she can recall until this past Sunday when she acutely developed first bilateral shoulder pain and then epigastric pain, which she describes as an intense pressure similar to gas pain. She did not have any nausea or emesis, and denies fevers. The pain resolved after some ibuprofen. Unfortunately recurred the following day which prompted her to go to the emergency department. There she was found to have an AST of 300 and ALT of 260, but otherwise completely normal labs. Her gallbladder ultrasound revealed a contracted gallbladder with multiple stones the largest of which measures 1.8 cm, although wall thickness 3 mm, common bile duct diameter 4 mm, normal-appearing liver. She has been sticking with a low-fat diet in order to minimize her chances of having recurrent pain, and is interested in having her gallbladder removed. He has not had any abdominal surgeries. She takes metformin for polycystic ovary syndrome and HCTZ for hypertension.   Past Surgical History  Oral Surgery  Diagnostic Studies History  Colonoscopy within last year Mammogram within last year Pap Smear 1-5 years ago  Allergies  No Known Drug Allergies 03/01/2017  Medication History  HydroCHLOROthiazide (12.5MG  Tablet, Oral) Active. MetFORMIN HCl (1000MG  Tablet, Oral) Active. Loratadine (10MG  Tablet, Oral) Active. Medications Reconciled  Social History  Alcohol use Occasional alcohol use. Caffeine use Carbonated beverages, Tea. No drug use Tobacco use Never smoker.  Family History  Arthritis Mother. Colon Cancer Father. Diabetes  Mellitus Father. Heart disease in female family member before age 4 Hypertension Father. Melanoma Father. Prostate Cancer Father. Rectal Cancer Father.  Pregnancy / Birth History  Age at menarche 36 years. Contraceptive History Oral contraceptives. Gravida 3 Length (months) of breastfeeding 3-6 Maternal age 72-30 Para 2 Regular periods  Other Problems  Back Pain Cholelithiasis High blood pressure Hypercholesterolemia     Review of Systems  General Present- Fatigue and Weight Gain. Not Present- Appetite Loss, Chills, Fever, Night Sweats and Weight Loss. Skin Not Present- Change in Wart/Mole, Dryness, Hives, Jaundice, New Lesions, Non-Healing Wounds, Rash and Ulcer. HEENT Present- Seasonal Allergies. Not Present- Earache, Hearing Loss, Hoarseness, Nose Bleed, Oral Ulcers, Ringing in the Ears, Sinus Pain, Sore Throat, Visual Disturbances, Wears glasses/contact lenses and Yellow Eyes. Respiratory Present- Snoring. Not Present- Bloody sputum, Chronic Cough, Difficulty Breathing and Wheezing. Breast Not Present- Breast Mass, Breast Pain, Nipple Discharge and Skin Changes. Cardiovascular Not Present- Chest Pain, Difficulty Breathing Lying Down, Leg Cramps, Palpitations, Rapid Heart Rate, Shortness of Breath and Swelling of Extremities. Gastrointestinal Present- Abdominal Pain, Change in Bowel Habits and Constipation. Not Present- Bloating, Bloody Stool, Chronic diarrhea, Difficulty Swallowing, Excessive gas, Gets full quickly at meals, Hemorrhoids, Indigestion, Nausea, Rectal Pain and Vomiting. Female Genitourinary Not Present- Frequency, Nocturia, Painful Urination, Pelvic Pain and Urgency. Musculoskeletal Present- Back Pain. Not Present- Joint Pain, Joint Stiffness, Muscle Pain, Muscle Weakness and Swelling of Extremities. Psychiatric Not Present- Anxiety, Bipolar, Change in Sleep Pattern, Depression, Fearful and Frequent crying. Endocrine Not Present- Cold  Intolerance, Excessive Hunger, Hair Changes, Heat Intolerance, Hot flashes and New Diabetes. Hematology Not Present- Blood Thinners, Easy Bruising, Excessive bleeding, Gland problems, HIV and Persistent Infections.  Vitals:   04/13/17 0548  BP:  131/72  Pulse: 66  Resp: 20  Temp: 98 F (36.7 C)     Physical Exam   General Note: Alert and oriented, well-appearing  Integumentary Note: No lesions or rashes on limited skin exam  Head and Neck Note: No mass or thyromegaly  Eye Note: Anicteric. Extraocular motion intact.  ENMT Note: Moist because membranes, good dentition  Chest and Lung Exam Note: Unlabored respirations, symmetrical air entry  Cardiovascular Note: Regular rate and rhythm, no pedal edema  Abdomen Note: Soft, nontender, nondistended. No mass or organomegaly. Currently no epigastric or right upper quadrant tenderness.  Neurologic Note: Grossly intact, normal gait  Neuropsychiatric Note: Normal mood and affect, appropriate insight  Musculoskeletal Note: Strength symmetrical throughout, no deformity    Assessment & Plan  CHRONIC CHOLECYSTITIS WITH CALCULUS (K80.10) Story: Although her symptoms have been a relatively short duration, and her ultrasound has the appearance of chronic cholecystitis. We discussed laparoscopic cholecystectomy. I counseled her as to the risks of surgery including bleeding, infection, pain, scarring, injury to the common bile duct and sequelae, conversion to open surgery. We discussed the expected recovery. She asked appropriate questions and is interested in proceeding with surgery which we will schedule at her earliest convenience. In the meantime I recommended that she continue with a low-fat diet and we reviewed signs and symptoms which should prompt her to return to the emergency room.

## 2017-04-13 NOTE — Anesthesia Procedure Notes (Signed)
Procedure Name: Intubation Date/Time: 04/13/2017 7:39 AM Performed by: Candida Peeling RAY Pre-anesthesia Checklist: Patient identified, Emergency Drugs available, Suction available and Patient being monitored Patient Re-evaluated:Patient Re-evaluated prior to inductionOxygen Delivery Method: Circle System Utilized Preoxygenation: Pre-oxygenation with 100% oxygen Intubation Type: IV induction Ventilation: Mask ventilation without difficulty Laryngoscope Size: 3 and Miller Grade View: Grade I Tube type: Oral Tube size: 7.0 mm Number of attempts: 1 Airway Equipment and Method: Stylet Placement Confirmation: ETT inserted through vocal cords under direct vision,  positive ETCO2 and breath sounds checked- equal and bilateral Secured at: 22 cm Tube secured with: Tape Dental Injury: Teeth and Oropharynx as per pre-operative assessment  Comments: Intubation by Brooke Bonito, SRNA

## 2017-04-13 NOTE — Anesthesia Preprocedure Evaluation (Signed)
Anesthesia Evaluation  Patient identified by MRN, date of birth, ID band Patient awake    Reviewed: Allergy & Precautions, NPO status , Patient's Chart, lab work & pertinent test results  Airway Mallampati: II  TM Distance: >3 FB Neck ROM: Full    Dental no notable dental hx.    Pulmonary neg pulmonary ROS,    Pulmonary exam normal breath sounds clear to auscultation       Cardiovascular hypertension, negative cardio ROS Normal cardiovascular exam Rhythm:Regular Rate:Normal     Neuro/Psych negative neurological ROS  negative psych ROS   GI/Hepatic negative GI ROS, Neg liver ROS,   Endo/Other  negative endocrine ROS  Renal/GU negative Renal ROS  negative genitourinary   Musculoskeletal negative musculoskeletal ROS (+)   Abdominal   Peds negative pediatric ROS (+)  Hematology negative hematology ROS (+)   Anesthesia Other Findings   Reproductive/Obstetrics negative OB ROS                             Anesthesia Physical  Anesthesia Plan  ASA: II  Anesthesia Plan: General   Post-op Pain Management:    Induction: Intravenous  Airway Management Planned: Oral ETT  Additional Equipment:   Intra-op Plan:   Post-operative Plan: Extubation in OR  Informed Consent: I have reviewed the patients History and Physical, chart, labs and discussed the procedure including the risks, benefits and alternatives for the proposed anesthesia with the patient or authorized representative who has indicated his/her understanding and acceptance.   Dental advisory given  Plan Discussed with: CRNA  Anesthesia Plan Comments:         Anesthesia Quick Evaluation  

## 2017-04-13 NOTE — Discharge Instructions (Signed)
[facility] ______[facility] °LAPAROSCOPIC SURGERY: POST OP INSTRUCTIONS °Always review your discharge instruction sheet given to you by the facility where your surgery was performed. °IF YOU HAVE DISABILITY OR FAMILY LEAVE FORMS, YOU MUST BRING THEM TO THE OFFICE FOR PROCESSING.   °DO NOT GIVE THEM TO YOUR DOCTOR. ° °1. A prescription for pain medication may be given to you upon discharge.  Take your pain medication as prescribed, if needed.  If narcotic pain medicine is not needed, then you may take acetaminophen (Tylenol) or ibuprofen (Advil) as needed. °2. Take your usually prescribed medications unless otherwise directed. °3. If you need a refill on your pain medication, please contact your pharmacy.  They will contact our office to request authorization. Prescriptions will not be filled after 5pm or on week-ends. °4. You should follow a light diet the first few days after arrival home, such as soup and crackers, etc.  Be sure to include lots of fluids daily. °5. Most patients will experience some swelling and bruising in the area of the incisions.  Ice packs will help.  Swelling and bruising can take several days to resolve.  °6. It is common to experience some constipation if taking pain medication after surgery.  Increasing fluid intake and taking a stool softener (such as Colace) will usually help or prevent this problem from occurring.  A mild laxative (Milk of Magnesia or Miralax) should be taken according to package instructions if there are no bowel movements after 48 hours. °7. Unless discharge instructions indicate otherwise, you may remove your bandages 24-48 hours after surgery, and you may shower at that time.  You may have steri-strips (small skin tapes) in place directly over the incision.  These strips should be left on the skin for 7-10 days.  If your surgeon used skin glue on the incision, you may shower in 24 hours.  The glue will flake off over the next 2-3 weeks.  Any sutures or  staples will be removed at the office during your follow-up visit. °8. ACTIVITIES:  You may resume regular (light) daily activities beginning the next day--such as daily self-care, walking, climbing stairs--gradually increasing activities as tolerated.  You may have sexual intercourse when it is comfortable.  Refrain from any heavy lifting or straining until approved by your doctor. °a. You may drive when you are no longer taking prescription pain medication, you can comfortably wear a seatbelt, and you can safely maneuver your car and apply brakes. °b. RETURN TO WORK:  __1 week________________________________________________________ °9. You should see your doctor in the office for a follow-up appointment approximately 2-3 weeks after your surgery.  Make sure that you call for this appointment within a day or two after you arrive home to insure a convenient appointment time. °10. OTHER INSTRUCTIONS: __________________________________________________________________________________________________________________________ __________________________________________________________________________________________________________________________ °WHEN TO CALL YOUR DOCTOR: °1. Fever over 101.0 °2. Inability to urinate °3. Continued bleeding from incision. °4. Increased pain, redness, or drainage from the incision. °5. Increasing abdominal pain ° °The clinic staff is available to answer your questions during regular business hours.  Please don’t hesitate to call and ask to speak to one of the nurses for clinical concerns.  If you have a medical emergency, go to the nearest emergency room or call 911.  A surgeon from [facility] is always on call at the hospital. °[address], [address], [place], [place]  [address] ? [address] [address], [place], [place]   [address] °[phone] ? [phone] ? FAX [phone] °Web   site: [email or web address] ° °

## 2017-04-13 NOTE — Op Note (Signed)
Operative Note  [name] 46 y.o. female [id number]  04/13/2017  Surgeon: Clovis Riley   Assistant: OR staff  Procedure performed: Laparoscopic Cholecystectomy  Preop diagnosis: chronic cholecystitis Post-op diagnosis/intraop findings: same  Specimens: gallbladder  EBL: minimal  Complications: none  Description of procedure: After obtaining informed consent the patient was brought to the operating room. Prophylactic antibiotics and subcutaneous heparin were administered. SCD's were applied. General endotracheal anesthesia was initiated and a formal time-out was performed. The abdomen was prepped and draped in the usual sterile fashion and the abdomen was entered using visiport technique in the left upper quadrant after instilling the site with local. Insufflation to 27mmHg was obtained and gross inspection revealed no evidence of injury from our entry or other intraabdominal abnormalities. Three 58mm trocars were introduced in the infraumbilical, right midclavicular and right anterior axillary lines under direct visualization and following infiltration with local. The entry port was upsized to a 58mm trocar. The gallbladder was retracted cephalad and the infundibulum was retracted laterally. A combination of hook electrocautery and blunt dissection was utilized to clear the peritoneum from the neck and cystic duct, circumferentially isolating the cystic artery and cystic duct and lifting the gallbladder from the cystic plate. The critical view of safety was achieved with the cystic artery, cystic duct, and liver bed visualized between them with no other structures. The artery was clipped with a single clip proximally and distally and divided as was the cystic duct with two clips on the proximal end. The gallbladder was dissected from the liver plate using electrocautery. Once freed the gallbladder was placed in an endocatch bag and removed intact through the epigastric trocar site, which had  to be extended slightly due to the large stones in the gallbladder. Hemostasis was once again confirmed, and reinspection of the abdomen revealed no injuries. The clips were well opposed without any bile leak from the duct or the liver bed. The 13mm trocar site in the epigastrium was closed with a 0 vicryl in the fascia under direct visualization using a PMI device. The abdomen was desufflated and all trocars removed. The skin incisions were closed with running subcuticular monocryl and Dermabond. The patient was awakened, extubated and transported to the recovery room in stable condition.   All counts were correct at the completion of the case.

## 2017-04-13 NOTE — Transfer of Care (Signed)
Immediate Anesthesia Transfer of Care Note  Patient: [name]  Procedure(s) Performed: Procedure(s): LAPAROSCOPIC CHOLECYSTECTOMY (N/A)  Patient Location: PACU  Anesthesia Type:General  Level of Consciousness: awake, alert  and oriented  Airway & Oxygen Therapy: Patient Spontanous Breathing and Patient connected to nasal cannula oxygen  Post-op Assessment: Report given to RN and Post -op Vital signs reviewed and stable  Post vital signs: Reviewed and stable  Last Vitals:  Vitals:   04/13/17 0548 04/13/17 0854  BP: 131/72 (!) 146/68  Pulse: 66 97  Resp: 20 12  Temp: 36.7 C 36.6 C    Last Pain:  Vitals:   04/13/17 0854  TempSrc:   PainSc: Asleep      Patients Stated Pain Goal: 2 (62/83/66 2947)  Complications: No apparent anesthesia complications

## 2017-04-14 ENCOUNTER — Encounter (HOSPITAL_COMMUNITY): Payer: Self-pay | Admitting: Surgery

## 2017-06-29 DIAGNOSIS — N920 Excessive and frequent menstruation with regular cycle: Secondary | ICD-10-CM | POA: Diagnosis not present

## 2017-09-26 DIAGNOSIS — J209 Acute bronchitis, unspecified: Secondary | ICD-10-CM | POA: Diagnosis not present

## 2017-10-25 DIAGNOSIS — R7303 Prediabetes: Secondary | ICD-10-CM | POA: Diagnosis not present

## 2017-10-25 DIAGNOSIS — I10 Essential (primary) hypertension: Secondary | ICD-10-CM | POA: Diagnosis not present

## 2017-10-25 DIAGNOSIS — Z0000 Encounter for general adult medical examination without abnormal findings: Secondary | ICD-10-CM | POA: Diagnosis not present

## 2017-10-25 DIAGNOSIS — Z23 Encounter for immunization: Secondary | ICD-10-CM | POA: Diagnosis not present

## 2017-10-25 DIAGNOSIS — E785 Hyperlipidemia, unspecified: Secondary | ICD-10-CM | POA: Diagnosis not present

## 2018-02-01 DIAGNOSIS — Z1231 Encounter for screening mammogram for malignant neoplasm of breast: Secondary | ICD-10-CM | POA: Diagnosis not present

## 2018-02-08 DIAGNOSIS — R922 Inconclusive mammogram: Secondary | ICD-10-CM | POA: Diagnosis not present

## 2018-02-08 DIAGNOSIS — N6489 Other specified disorders of breast: Secondary | ICD-10-CM | POA: Diagnosis not present

## 2018-02-11 DIAGNOSIS — J019 Acute sinusitis, unspecified: Secondary | ICD-10-CM | POA: Diagnosis not present

## 2018-02-12 ENCOUNTER — Other Ambulatory Visit: Payer: Self-pay | Admitting: Radiology

## 2018-02-12 DIAGNOSIS — N6011 Diffuse cystic mastopathy of right breast: Secondary | ICD-10-CM | POA: Diagnosis not present

## 2018-02-12 DIAGNOSIS — N6314 Unspecified lump in the right breast, lower inner quadrant: Secondary | ICD-10-CM | POA: Diagnosis not present

## 2018-02-13 DIAGNOSIS — Z6827 Body mass index (BMI) 27.0-27.9, adult: Secondary | ICD-10-CM | POA: Diagnosis not present

## 2018-02-13 DIAGNOSIS — Z01419 Encounter for gynecological examination (general) (routine) without abnormal findings: Secondary | ICD-10-CM | POA: Diagnosis not present

## 2018-03-07 DIAGNOSIS — Z803 Family history of malignant neoplasm of breast: Secondary | ICD-10-CM | POA: Diagnosis not present

## 2018-08-29 DIAGNOSIS — N6011 Diffuse cystic mastopathy of right breast: Secondary | ICD-10-CM | POA: Diagnosis not present

## 2018-11-07 DIAGNOSIS — E785 Hyperlipidemia, unspecified: Secondary | ICD-10-CM | POA: Diagnosis not present

## 2018-11-07 DIAGNOSIS — R7303 Prediabetes: Secondary | ICD-10-CM | POA: Diagnosis not present

## 2018-11-07 DIAGNOSIS — I10 Essential (primary) hypertension: Secondary | ICD-10-CM | POA: Diagnosis not present

## 2019-02-03 DIAGNOSIS — Z1231 Encounter for screening mammogram for malignant neoplasm of breast: Secondary | ICD-10-CM | POA: Diagnosis not present

## 2019-02-17 DIAGNOSIS — Z6828 Body mass index (BMI) 28.0-28.9, adult: Secondary | ICD-10-CM | POA: Diagnosis not present

## 2019-02-17 DIAGNOSIS — Z01419 Encounter for gynecological examination (general) (routine) without abnormal findings: Secondary | ICD-10-CM | POA: Diagnosis not present

## 2019-03-13 DIAGNOSIS — D251 Intramural leiomyoma of uterus: Secondary | ICD-10-CM | POA: Diagnosis not present

## 2019-03-13 DIAGNOSIS — N939 Abnormal uterine and vaginal bleeding, unspecified: Secondary | ICD-10-CM | POA: Diagnosis not present

## 2019-07-08 ENCOUNTER — Other Ambulatory Visit: Payer: Self-pay | Admitting: *Deleted

## 2019-07-08 DIAGNOSIS — Z20822 Contact with and (suspected) exposure to covid-19: Secondary | ICD-10-CM

## 2019-07-08 NOTE — Progress Notes (Signed)
[id number] 

## 2019-07-13 LAB — NOVEL CORONAVIRUS, NAA: SARS-CoV-2, NAA: NOT DETECTED

## 2019-08-14 ENCOUNTER — Other Ambulatory Visit: Payer: Self-pay | Admitting: Obstetrics and Gynecology

## 2019-08-22 ENCOUNTER — Other Ambulatory Visit: Payer: Self-pay

## 2019-08-22 ENCOUNTER — Encounter (HOSPITAL_COMMUNITY)
Admission: RE | Admit: 2019-08-22 | Discharge: 2019-08-22 | Disposition: A | Discharge status: Home / Self Care | Payer: 59 | Source: Ambulatory Visit | Attending: Obstetrics and Gynecology | Admitting: Obstetrics and Gynecology

## 2019-08-22 ENCOUNTER — Encounter (HOSPITAL_COMMUNITY): Payer: Self-pay

## 2019-08-22 DIAGNOSIS — D259 Leiomyoma of uterus, unspecified: Secondary | ICD-10-CM | POA: Insufficient documentation

## 2019-08-22 DIAGNOSIS — R9431 Abnormal electrocardiogram [ECG] [EKG]: Secondary | ICD-10-CM | POA: Diagnosis not present

## 2019-08-22 DIAGNOSIS — Z20828 Contact with and (suspected) exposure to other viral communicable diseases: Secondary | ICD-10-CM | POA: Diagnosis not present

## 2019-08-22 DIAGNOSIS — Z01818 Encounter for other preprocedural examination: Secondary | ICD-10-CM | POA: Diagnosis present

## 2019-08-22 LAB — BASIC METABOLIC PANEL
Anion gap: 8 (ref 5–15)
BUN: 11 mg/dL (ref 6–20)
CO2: 27 mmol/L (ref 22–32)
Calcium: 9 mg/dL (ref 8.9–10.3)
Chloride: 103 mmol/L (ref 98–111)
Creatinine, Ser: 0.72 mg/dL (ref 0.44–1.00)
GFR calc Af Amer: >60 mL/min (ref >60)
GFR calc non Af Amer: >60 mL/min (ref >60)
Glucose, Bld: 87 mg/dL (ref 70–99)
Potassium: 3.6 mmol/L (ref 3.5–5.1)
Sodium: 138 mmol/L (ref 135–145)

## 2019-08-22 LAB — CBC
HCT: 36.2 % (ref 36.0–46.0)
Hemoglobin: 11.1 g/dL — ABNORMAL LOW (ref 12.0–15.0)
MCH: 26.2 pg (ref 26.0–34.0)
MCHC: 30.7 g/dL (ref 30.0–36.0)
MCV: 85.4 fL (ref 80.0–100.0)
Platelets: 386 10*3/uL (ref 150–400)
RBC: 4.24 MIL/uL (ref 3.87–5.11)
RDW: 14 % (ref 11.5–15.5)
WBC: 5.7 10*3/uL (ref 4.0–10.5)
nRBC: 0 % (ref 0.0–0.2)

## 2019-08-22 LAB — ABO/RH: ABO/RH(D): A POS

## 2019-08-22 NOTE — Progress Notes (Signed)
PCP - Dr. Laurann Montana Cardiologist - none  Chest x-ray - no EKG - on chart Stress Test - no ECHO - no Cardiac Cath - no  Sleep Study - yes findings negative CPAP - no  Fasting Blood Sugar - NA Checks Blood Sugar _____ times a day  Blood Thinner Instructions:NA Aspirin Instructions: Last Dose:  Anesthesia review:   Patient denies shortness of breath, fever, cough and chest pain at PAT appointment  yes   Patient verbalized understanding of instructions that were given to them at the PAT appointment. Patient was also instructed that they will need to review over the PAT instructions again at home before surgery.  yes

## 2019-08-22 NOTE — Patient Instructions (Addendum)
YOU NEED TO HAVE A COVID 19 TEST ON_Monday 8/31______ @_11 :00______, THIS TEST MUST BE DONE BEFORE SURGERY, COME  [address], [place] , [address].  ([place])  ONCE YOUR COVID TEST IS COMPLETED, PLEASE BEGIN THE QUARANTINE INSTRUCTIONS AS OUTLINED IN YOUR HANDOUT.                [name]    Your procedure is scheduled on: Thursday 08/28/19    Report to [facility]   Call this number if you have problems the morning of surgery  :[phone].   OUR ADDRESS IS [place].  WE ARE LOCATED IN [facility].                                     REMEMBER:  DO NOT EAT FOOD OR DRINK LIQUIDS AFTER MIDNIGHT  .   TAKE THESE MEDICATIONS MORNING OF SURGERY WITH A SIP OF WATER:  __Claritin, Flonase________________________________  IF YOU ARE SPENDING THE NIGHT AFTER SURGERY PLEASE BRING ALL YOUR PRESCRIPTION MEDICATIONS IN THEIR ORIGINAL BOTTLES.                                    DO NOT WEAR JEWERLY, MAKE UP, OR NAIL POLISH,  DO NOT WEAR LOTIONS, POWDERS, PERFUMES OR DEODORANT. DO NOT SHAVE FOR 24 HOURS PRIOR TO DAY OF SURGERY. MEN MAY SHAVE FACE AND NECK. CONTACTS, GLASSES, OR DENTURES MAY NOT BE WORN TO SURGERY.                                    [facility] IS NOT RESPONSIBLE  FOR ANY BELONGINGS.                                                                    Marland Kitchen                              BRUSH YOUR TEETH MORNING OF SURGERY AND RINSE YOUR MOUTH OUT, NO CHEWING GUM CANDY OR [place] light. Just the essentials    Patients discharged the day of surgery will not be allowed to drive home.   IF YOU ARE HAVING SURGERY AND GOING HOME THE SAME DAY, YOU MUST HAVE AN ADULT TO DRIVE YOU HOME AND BE WITH YOU FOR 24 HOURS.  YOU MAY GO HOME BY TAXI OR UBER OR ORTHERWISE, BUT AN ADULT MUST ACCOMPANY YOU HOME AND STAY WITH YOU FOR 24 HOURS.  Name and phone number of your driver:  Special Instructions: N/A               Please read over the following fact sheets you were given: _____________________________________________________________________             [facility] - Preparing for Surgery Before surgery, you can play an important role.  Because skin is not sterile, your skin needs to be as  free of germs as possible.   You can reduce the number of germs on your skin by washing with CHG (chlorahexidine gluconate) soap before surgery.   CHG is an antiseptic cleaner which kills germs and bonds with the skin to continue killing germs even after washing. Please DO NOT use if you have an allergy to CHG or antibacterial soaps.   If your skin becomes reddened/irritated stop using the CHG and inform your nurse when you arrive at Short Stay. Do not shave (including legs and underarms) for at least 48 hours prior to the first CHG shower.   .Please follow these instructions carefully:  1.  Shower with CHG Soap the night before surgery and the  morning of Surgery.  2.  If you choose to wash your hair, wash your hair first as usual with your  normal  shampoo.  3.  After you shampoo, rinse your hair and body thoroughly to remove the  shampoo.                                        4.  Use CHG as you would any other liquid soap.  You can apply chg directly  to the skin and wash                       Gently with a scrungie or clean washcloth.  5.  Apply the CHG Soap to your body ONLY FROM THE NECK DOWN.   Do not use on face/ open                           Wound or open sores. Avoid contact with eyes, ears mouth and genitals (private parts).                       Wash face,  Genitals (private parts) with your normal soap.             6.  Wash thoroughly, paying special attention to the area where your surgery  will be performed.  7.  Thoroughly rinse your body with warm water from the neck down.  8.  DO NOT shower/wash with your normal soap after using and rinsing off  the CHG Soap.             9.  Pat yourself  dry with a clean towel.            10.  Wear clean pajamas.            11.  Place clean sheets on your bed the night of your first shower and do not  sleep with pets.  Day of Surgery : Do not apply any lotions/deodorants the morning of surgery.  Please wear clean clothes to the hospital/surgery center.    FAILURE TO FOLLOW THESE INSTRUCTIONS MAY RESULT IN THE CANCELLATION OF YOUR SURGERY PATIENT SIGNATURE_________________________________  NURSE SIGNATURE__________________________________  ________________________________________________________________________

## 2019-08-25 ENCOUNTER — Other Ambulatory Visit (HOSPITAL_COMMUNITY)
Admission: RE | Admit: 2019-08-25 | Discharge: 2019-08-25 | Disposition: A | Discharge status: Home / Self Care | Payer: 59 | Source: Ambulatory Visit | Attending: Obstetrics and Gynecology | Admitting: Obstetrics and Gynecology

## 2019-08-25 DIAGNOSIS — Z01818 Encounter for other preprocedural examination: Secondary | ICD-10-CM | POA: Diagnosis not present

## 2019-08-25 LAB — SARS CORONAVIRUS 2 (TAT 6-24 HRS): SARS Coronavirus 2: NEGATIVE

## 2019-08-27 NOTE — Anesthesia Preprocedure Evaluation (Addendum)
Anesthesia Evaluation  Patient identified by MRN, date of birth, ID band Patient awake    Reviewed: Allergy & Precautions, NPO status , Patient's Chart, lab work & pertinent test results  History of Anesthesia Complications Negative for: history of anesthetic complications  Airway Mallampati: II  TM Distance: >3 FB Neck ROM: Full    Dental  (+) Teeth Intact   Pulmonary neg pulmonary ROS,    Pulmonary exam normal        Cardiovascular hypertension, Pt. on medications Normal cardiovascular exam     Neuro/Psych negative neurological ROS  negative psych ROS   GI/Hepatic negative GI ROS, Neg liver ROS,   Endo/Other  PCOS  Renal/GU negative Renal ROS  negative genitourinary   Musculoskeletal negative musculoskeletal ROS (+)   Abdominal   Peds  Hematology  (+) anemia ,   Anesthesia Other Findings Hgb 11.1  Reproductive/Obstetrics                           Anesthesia Physical Anesthesia Plan  ASA: II  Anesthesia Plan: General   Post-op Pain Management:    Induction: Intravenous  PONV Risk Score and Plan: 4 or greater and Ondansetron, Dexamethasone, Treatment may vary due to age or medical condition and Midazolam  Airway Management Planned: Oral ETT  Additional Equipment: None  Intra-op Plan:   Post-operative Plan: Extubation in OR  Informed Consent: I have reviewed the patients History and Physical, chart, labs and discussed the procedure including the risks, benefits and alternatives for the proposed anesthesia with the patient or authorized representative who has indicated his/her understanding and acceptance.     Dental advisory given  Plan Discussed with:   Anesthesia Plan Comments:        Anesthesia Quick Evaluation

## 2019-08-28 ENCOUNTER — Encounter (HOSPITAL_BASED_OUTPATIENT_CLINIC_OR_DEPARTMENT_OTHER)
Admission: RE | Disposition: A | Discharge status: Home / Self Care | Payer: Self-pay | Source: Home / Self Care | Attending: Obstetrics and Gynecology

## 2019-08-28 ENCOUNTER — Ambulatory Visit (HOSPITAL_BASED_OUTPATIENT_CLINIC_OR_DEPARTMENT_OTHER): Payer: 59 | Admitting: Anesthesiology

## 2019-08-28 ENCOUNTER — Ambulatory Visit (HOSPITAL_BASED_OUTPATIENT_CLINIC_OR_DEPARTMENT_OTHER)
Admission: RE | Admit: 2019-08-28 | Discharge: 2019-08-29 | Disposition: A | Discharge status: Home / Self Care | Payer: 59 | Attending: Obstetrics and Gynecology | Admitting: Obstetrics and Gynecology

## 2019-08-28 ENCOUNTER — Ambulatory Visit (HOSPITAL_BASED_OUTPATIENT_CLINIC_OR_DEPARTMENT_OTHER): Payer: 59 | Admitting: Physician Assistant

## 2019-08-28 ENCOUNTER — Encounter (HOSPITAL_BASED_OUTPATIENT_CLINIC_OR_DEPARTMENT_OTHER): Payer: Self-pay

## 2019-08-28 DIAGNOSIS — N888 Other specified noninflammatory disorders of cervix uteri: Secondary | ICD-10-CM | POA: Diagnosis not present

## 2019-08-28 DIAGNOSIS — Z7984 Long term (current) use of oral hypoglycemic drugs: Secondary | ICD-10-CM | POA: Insufficient documentation

## 2019-08-28 DIAGNOSIS — Z79899 Other long term (current) drug therapy: Secondary | ICD-10-CM | POA: Insufficient documentation

## 2019-08-28 DIAGNOSIS — D649 Anemia, unspecified: Secondary | ICD-10-CM | POA: Insufficient documentation

## 2019-08-28 DIAGNOSIS — D259 Leiomyoma of uterus, unspecified: Secondary | ICD-10-CM | POA: Diagnosis present

## 2019-08-28 DIAGNOSIS — Z791 Long term (current) use of non-steroidal anti-inflammatories (NSAID): Secondary | ICD-10-CM | POA: Diagnosis not present

## 2019-08-28 DIAGNOSIS — N838 Other noninflammatory disorders of ovary, fallopian tube and broad ligament: Secondary | ICD-10-CM | POA: Diagnosis not present

## 2019-08-28 DIAGNOSIS — I10 Essential (primary) hypertension: Secondary | ICD-10-CM | POA: Diagnosis not present

## 2019-08-28 DIAGNOSIS — Z90710 Acquired absence of both cervix and uterus: Secondary | ICD-10-CM | POA: Diagnosis present

## 2019-08-28 HISTORY — PX: ROBOTIC ASSISTED LAPAROSCOPIC HYSTERECTOMY AND SALPINGECTOMY: SHX6379

## 2019-08-28 LAB — TYPE AND SCREEN
ABO/RH(D): A POS
Antibody Screen: NEGATIVE

## 2019-08-28 LAB — POCT PREGNANCY, URINE: Preg Test, Ur: NEGATIVE

## 2019-08-28 SURGERY — XI ROBOTIC ASSISTED LAPAROSCOPIC HYSTERECTOMY AND SALPINGECTOMY
Anesthesia: General | Laterality: Bilateral

## 2019-08-28 MED ORDER — ONDANSETRON HCL 4 MG/2ML IJ SOLN
INTRAMUSCULAR | Status: AC
  Filled 2019-08-28: qty 2

## 2019-08-28 MED ORDER — LACTATED RINGERS IV SOLN
INTRAVENOUS | Status: DC
  Administered 2019-08-28: 07:00:00 via INTRAVENOUS
  Filled 2019-08-28: qty 1000

## 2019-08-28 MED ORDER — PROPOFOL 10 MG/ML IV BOLUS
INTRAVENOUS | Status: DC | PRN
  Administered 2019-08-28: 50 mg via INTRAVENOUS
  Administered 2019-08-28: 150 mg via INTRAVENOUS

## 2019-08-28 MED ORDER — FENTANYL CITRATE (PF) 100 MCG/2ML IJ SOLN
INTRAMUSCULAR | Status: DC | PRN
  Administered 2019-08-28: 100 ug via INTRAVENOUS
  Administered 2019-08-28 (×2): 50 ug via INTRAVENOUS

## 2019-08-28 MED ORDER — CEFAZOLIN SODIUM-DEXTROSE 2-4 GM/100ML-% IV SOLN
2.0000 g | INTRAVENOUS | Status: AC
  Administered 2019-08-28: 2 g via INTRAVENOUS
  Filled 2019-08-28: qty 100

## 2019-08-28 MED ORDER — KETOROLAC TROMETHAMINE 30 MG/ML IJ SOLN
INTRAMUSCULAR | Status: AC
  Filled 2019-08-28: qty 1

## 2019-08-28 MED ORDER — MIDAZOLAM HCL 2 MG/2ML IJ SOLN
INTRAMUSCULAR | Status: AC
  Filled 2019-08-28: qty 2

## 2019-08-28 MED ORDER — ONDANSETRON HCL 4 MG/2ML IJ SOLN
INTRAMUSCULAR | Status: DC | PRN
  Administered 2019-08-28: 4 mg via INTRAVENOUS

## 2019-08-28 MED ORDER — PROPOFOL 10 MG/ML IV BOLUS
INTRAVENOUS | Status: AC
  Filled 2019-08-28: qty 40

## 2019-08-28 MED ORDER — MIDAZOLAM HCL 5 MG/5ML IJ SOLN
INTRAMUSCULAR | Status: DC | PRN
  Administered 2019-08-28: 2 mg via INTRAVENOUS

## 2019-08-28 MED ORDER — OXYCODONE HCL 5 MG PO TABS
5.0000 mg | ORAL_TABLET | Freq: Once | ORAL | Status: AC | PRN
  Administered 2019-08-28: 5 mg via ORAL
  Filled 2019-08-28: qty 1

## 2019-08-28 MED ORDER — SODIUM CHLORIDE 0.9 % IR SOLN
Status: DC | PRN
  Administered 2019-08-28: 3000 mL

## 2019-08-28 MED ORDER — DEXAMETHASONE SODIUM PHOSPHATE 10 MG/ML IJ SOLN
INTRAMUSCULAR | Status: DC | PRN
  Administered 2019-08-28: 10 mg via INTRAVENOUS

## 2019-08-28 MED ORDER — SCOPOLAMINE 1 MG/3DAYS TD PT72
1.0000 | MEDICATED_PATCH | TRANSDERMAL | Status: DC
  Administered 2019-08-28: 1.5 mg via TRANSDERMAL
  Filled 2019-08-28: qty 1

## 2019-08-28 MED ORDER — METFORMIN HCL ER 750 MG PO TB24
1500.0000 mg | ORAL_TABLET | Freq: Every day | ORAL | Status: DC
  Filled 2019-08-28 (×2): qty 2

## 2019-08-28 MED ORDER — OXYCODONE HCL 5 MG/5ML PO SOLN
5.0000 mg | Freq: Once | ORAL | Status: AC | PRN
  Filled 2019-08-28: qty 5

## 2019-08-28 MED ORDER — ACETAMINOPHEN 500 MG PO TABS
ORAL_TABLET | ORAL | Status: AC
  Filled 2019-08-28: qty 2

## 2019-08-28 MED ORDER — FENTANYL CITRATE (PF) 250 MCG/5ML IJ SOLN
INTRAMUSCULAR | Status: AC
  Filled 2019-08-28: qty 5

## 2019-08-28 MED ORDER — IBUPROFEN 800 MG PO TABS
800.0000 mg | ORAL_TABLET | Freq: Four times a day (QID) | ORAL | Status: DC
  Filled 2019-08-28: qty 1

## 2019-08-28 MED ORDER — HYDROCHLOROTHIAZIDE 12.5 MG PO CAPS
12.5000 mg | ORAL_CAPSULE | Freq: Every evening | ORAL | Status: DC
  Administered 2019-08-28: 12.5 mg via ORAL
  Filled 2019-08-28 (×3): qty 1

## 2019-08-28 MED ORDER — SODIUM CHLORIDE (PF) 0.9 % IJ SOLN
INTRAMUSCULAR | Status: AC
  Filled 2019-08-28: qty 50

## 2019-08-28 MED ORDER — PANTOPRAZOLE SODIUM 40 MG PO TBEC
40.0000 mg | DELAYED_RELEASE_TABLET | Freq: Every day | ORAL | Status: DC
  Administered 2019-08-28: 40 mg via ORAL
  Filled 2019-08-28: qty 1

## 2019-08-28 MED ORDER — DEXTROSE IN LACTATED RINGERS 5 % IV SOLN
INTRAVENOUS | Status: DC
  Administered 2019-08-28: 11:00:00 via INTRAVENOUS
  Filled 2019-08-28 (×2): qty 1000

## 2019-08-28 MED ORDER — SIMETHICONE 80 MG PO CHEW
80.0000 mg | CHEWABLE_TABLET | Freq: Four times a day (QID) | ORAL | Status: DC | PRN
  Filled 2019-08-28: qty 1

## 2019-08-28 MED ORDER — ROPIVACAINE HCL 5 MG/ML IJ SOLN
INTRAMUSCULAR | Status: AC
  Filled 2019-08-28: qty 30

## 2019-08-28 MED ORDER — ROCURONIUM BROMIDE 10 MG/ML (PF) SYRINGE
PREFILLED_SYRINGE | INTRAVENOUS | Status: AC
  Filled 2019-08-28: qty 10

## 2019-08-28 MED ORDER — FENTANYL CITRATE (PF) 100 MCG/2ML IJ SOLN
INTRAMUSCULAR | Status: AC
  Filled 2019-08-28: qty 2

## 2019-08-28 MED ORDER — LIDOCAINE 2% (20 MG/ML) 5 ML SYRINGE
INTRAMUSCULAR | Status: AC
  Filled 2019-08-28: qty 5

## 2019-08-28 MED ORDER — KETAMINE HCL 10 MG/ML IJ SOLN
INTRAMUSCULAR | Status: AC
  Filled 2019-08-28: qty 1

## 2019-08-28 MED ORDER — CEFAZOLIN SODIUM-DEXTROSE 2-4 GM/100ML-% IV SOLN
INTRAVENOUS | Status: AC
  Filled 2019-08-28: qty 100

## 2019-08-28 MED ORDER — KETOROLAC TROMETHAMINE 30 MG/ML IJ SOLN
30.0000 mg | Freq: Four times a day (QID) | INTRAMUSCULAR | Status: AC
  Administered 2019-08-28 – 2019-08-29 (×4): 30 mg via INTRAVENOUS
  Filled 2019-08-28: qty 1

## 2019-08-28 MED ORDER — PANTOPRAZOLE SODIUM 40 MG PO TBEC
DELAYED_RELEASE_TABLET | ORAL | Status: AC
  Filled 2019-08-28: qty 1

## 2019-08-28 MED ORDER — KETOROLAC TROMETHAMINE 30 MG/ML IJ SOLN
INTRAMUSCULAR | Status: DC | PRN
  Administered 2019-08-28: 30 mg via INTRAVENOUS

## 2019-08-28 MED ORDER — EPHEDRINE SULFATE 50 MG/ML IJ SOLN
INTRAMUSCULAR | Status: DC | PRN
  Administered 2019-08-28: 10 mg via INTRAVENOUS

## 2019-08-28 MED ORDER — LORATADINE 10 MG PO TABS
10.0000 mg | ORAL_TABLET | Freq: Every day | ORAL | Status: DC
  Filled 2019-08-28: qty 1

## 2019-08-28 MED ORDER — PROPOFOL 500 MG/50ML IV EMUL
INTRAVENOUS | Status: AC
  Filled 2019-08-28: qty 50

## 2019-08-28 MED ORDER — SCOPOLAMINE 1 MG/3DAYS TD PT72
MEDICATED_PATCH | TRANSDERMAL | Status: AC
  Filled 2019-08-28: qty 1

## 2019-08-28 MED ORDER — LIDOCAINE 2% (20 MG/ML) 5 ML SYRINGE
INTRAMUSCULAR | Status: DC | PRN
  Administered 2019-08-28: 1.5 mg/kg/h via INTRAVENOUS

## 2019-08-28 MED ORDER — ARTIFICIAL TEARS OPHTHALMIC OINT
TOPICAL_OINTMENT | OPHTHALMIC | Status: AC
  Filled 2019-08-28: qty 3.5

## 2019-08-28 MED ORDER — ACETAMINOPHEN 500 MG PO TABS
1000.0000 mg | ORAL_TABLET | Freq: Once | ORAL | Status: DC
  Filled 2019-08-28: qty 2

## 2019-08-28 MED ORDER — ONDANSETRON HCL 4 MG/2ML IJ SOLN
4.0000 mg | Freq: Once | INTRAMUSCULAR | Status: DC | PRN
  Filled 2019-08-28: qty 2

## 2019-08-28 MED ORDER — ONDANSETRON HCL 4 MG/2ML IJ SOLN
4.0000 mg | Freq: Four times a day (QID) | INTRAMUSCULAR | Status: DC | PRN
  Filled 2019-08-28: qty 2

## 2019-08-28 MED ORDER — OXYCODONE HCL 5 MG PO TABS
ORAL_TABLET | ORAL | Status: AC
  Filled 2019-08-28: qty 1

## 2019-08-28 MED ORDER — EPHEDRINE 5 MG/ML INJ
INTRAVENOUS | Status: AC
  Filled 2019-08-28: qty 10

## 2019-08-28 MED ORDER — MENTHOL 3 MG MT LOZG
1.0000 | LOZENGE | OROMUCOSAL | Status: DC | PRN
  Filled 2019-08-28: qty 9

## 2019-08-28 MED ORDER — OXYCODONE HCL 5 MG PO TABS
5.0000 mg | ORAL_TABLET | ORAL | Status: DC | PRN
  Filled 2019-08-28: qty 2

## 2019-08-28 MED ORDER — ONDANSETRON HCL 4 MG PO TABS
4.0000 mg | ORAL_TABLET | Freq: Four times a day (QID) | ORAL | Status: DC | PRN
  Filled 2019-08-28: qty 1

## 2019-08-28 MED ORDER — BUPIVACAINE HCL (PF) 0.25 % IJ SOLN
INTRAMUSCULAR | Status: DC | PRN
  Administered 2019-08-28: 14 mL

## 2019-08-28 MED ORDER — ACETAMINOPHEN 500 MG PO TABS
1000.0000 mg | ORAL_TABLET | Freq: Four times a day (QID) | ORAL | Status: DC
  Administered 2019-08-28 – 2019-08-29 (×4): 1000 mg via ORAL
  Filled 2019-08-28: qty 2

## 2019-08-28 MED ORDER — BUPIVACAINE HCL (PF) 0.25 % IJ SOLN
INTRAMUSCULAR | Status: AC
  Filled 2019-08-28: qty 30

## 2019-08-28 MED ORDER — FLUTICASONE PROPIONATE 50 MCG/ACT NA SUSP
2.0000 | Freq: Every day | NASAL | Status: DC
  Filled 2019-08-28: qty 16

## 2019-08-28 MED ORDER — FENTANYL CITRATE (PF) 100 MCG/2ML IJ SOLN
25.0000 ug | INTRAMUSCULAR | Status: DC | PRN
  Administered 2019-08-28: 50 ug via INTRAVENOUS
  Administered 2019-08-28: 25 ug via INTRAVENOUS
  Filled 2019-08-28: qty 1

## 2019-08-28 MED ORDER — LIDOCAINE 2% (20 MG/ML) 5 ML SYRINGE
INTRAMUSCULAR | Status: DC | PRN
  Administered 2019-08-28: 100 mg via INTRAVENOUS

## 2019-08-28 MED ORDER — SENNOSIDES-DOCUSATE SODIUM 8.6-50 MG PO TABS
1.0000 | ORAL_TABLET | Freq: Every evening | ORAL | Status: DC | PRN
  Filled 2019-08-28: qty 1

## 2019-08-28 MED ORDER — KETAMINE HCL 10 MG/ML IJ SOLN
INTRAMUSCULAR | Status: DC | PRN
  Administered 2019-08-28: 30 mg via INTRAVENOUS

## 2019-08-28 MED ORDER — ROCURONIUM BROMIDE 50 MG/5ML IV SOSY
PREFILLED_SYRINGE | INTRAVENOUS | Status: DC | PRN
  Administered 2019-08-28: 50 mg via INTRAVENOUS
  Administered 2019-08-28: 20 mg via INTRAVENOUS

## 2019-08-28 MED ORDER — DEXAMETHASONE SODIUM PHOSPHATE 10 MG/ML IJ SOLN
INTRAMUSCULAR | Status: AC
  Filled 2019-08-28: qty 1

## 2019-08-28 MED ORDER — SUGAMMADEX SODIUM 200 MG/2ML IV SOLN
INTRAVENOUS | Status: DC | PRN
  Administered 2019-08-28: 160 mg via INTRAVENOUS

## 2019-08-28 SURGICAL SUPPLY — 55 items
ADH SKN CLS APL DERMABOND .7 (GAUZE/BANDAGES/DRESSINGS) ×1
APL SRG 38 LTWT LNG FL B (MISCELLANEOUS) ×1
APPLICATOR ARISTA FLEXITIP XL (MISCELLANEOUS) ×2 IMPLANT
BARRIER ADHS 3X4 INTERCEED (GAUZE/BANDAGES/DRESSINGS) IMPLANT
BRR ADH 4X3 ABS CNTRL BYND (GAUZE/BANDAGES/DRESSINGS)
CANISTER SUCT 3000ML PPV (MISCELLANEOUS) ×2 IMPLANT
CATH FOLEY 3WAY  5CC 16FR (CATHETERS) ×1
CATH FOLEY 3WAY 5CC 16FR (CATHETERS) ×1 IMPLANT
COVER BACK TABLE 60X90IN (DRAPES) ×2 IMPLANT
COVER TIP SHEARS 8 DVNC (MISCELLANEOUS) ×1 IMPLANT
COVER TIP SHEARS 8MM DA VINCI (MISCELLANEOUS) ×1
DECANTER SPIKE VIAL GLASS SM (MISCELLANEOUS) ×2 IMPLANT
DEFOGGER SCOPE WARMER CLEARIFY (MISCELLANEOUS) ×2 IMPLANT
DERMABOND ADVANCED (GAUZE/BANDAGES/DRESSINGS) ×1
DERMABOND ADVANCED .7 DNX12 (GAUZE/BANDAGES/DRESSINGS) ×1 IMPLANT
DRAPE ARM DVNC X/XI (DISPOSABLE) ×4 IMPLANT
DRAPE COLUMN DVNC XI (DISPOSABLE) ×1 IMPLANT
DRAPE DA VINCI XI ARM (DISPOSABLE) ×4
DRAPE DA VINCI XI COLUMN (DISPOSABLE) ×1
DRAPE HYSTEROSCOPY (DRAPE) ×2 IMPLANT
DURAPREP 26ML APPLICATOR (WOUND CARE) ×2 IMPLANT
ELECT REM PT RETURN 9FT ADLT (ELECTROSURGICAL) ×2
ELECTRODE REM PT RTRN 9FT ADLT (ELECTROSURGICAL) ×1 IMPLANT
GLOVE BIOGEL PI IND STRL 7.0 (GLOVE) ×5 IMPLANT
GLOVE BIOGEL PI INDICATOR 7.0 (GLOVE) ×5
GLOVE ECLIPSE 6.5 STRL STRAW (GLOVE) ×6 IMPLANT
HEMOSTAT ARISTA ABSORB 3G PWDR (HEMOSTASIS) ×1 IMPLANT
IRRIG SUCT STRYKERFLOW 2 WTIP (MISCELLANEOUS) ×2
IRRIGATION SUCT STRKRFLW 2 WTP (MISCELLANEOUS) ×1 IMPLANT
LEGGING LITHOTOMY PAIR STRL (DRAPES) ×2 IMPLANT
NEEDLE INSUFFLATION 120MM (ENDOMECHANICALS) ×2 IMPLANT
OBTURATOR OPTICAL STANDARD 8MM (TROCAR) ×1
OBTURATOR OPTICAL STND 8 DVNC (TROCAR) ×1
OBTURATOR OPTICALSTD 8 DVNC (TROCAR) ×1 IMPLANT
OCCLUDER COLPOPNEUMO (BALLOONS) ×2 IMPLANT
PACK ROBOT WH (CUSTOM PROCEDURE TRAY) ×2 IMPLANT
PACK ROBOTIC GOWN (GOWN DISPOSABLE) ×2 IMPLANT
PACK TRENDGUARD 450 HYBRID PRO (MISCELLANEOUS) ×1 IMPLANT
PAD PREP 24X48 CUFFED NSTRL (MISCELLANEOUS) ×2 IMPLANT
PROTECTOR NERVE ULNAR (MISCELLANEOUS) ×4 IMPLANT
SEAL CANN UNIV 5-8 DVNC XI (MISCELLANEOUS) ×4 IMPLANT
SEAL XI 5MM-8MM UNIVERSAL (MISCELLANEOUS) ×4
SEALER VESSEL DA VINCI XI (MISCELLANEOUS) ×1
SEALER VESSEL EXT DVNC XI (MISCELLANEOUS) ×1 IMPLANT
SET CYSTO W/LG BORE CLAMP LF (SET/KITS/TRAYS/PACK) IMPLANT
SET TRI-LUMEN FLTR TB AIRSEAL (TUBING) ×2 IMPLANT
SUT VIC AB 0 CT1 36 (SUTURE) ×4 IMPLANT
SUT VICRYL 4-0 PS2 18IN ABS (SUTURE) ×4 IMPLANT
SUT VLOC 180 0 9IN  GS21 (SUTURE) ×1
SUT VLOC 180 0 9IN GS21 (SUTURE) ×1 IMPLANT
TIP UTERINE 6.7X10CM GRN DISP (MISCELLANEOUS) ×1 IMPLANT
TOWEL OR 17X26 10 PK STRL BLUE (TOWEL DISPOSABLE) ×4 IMPLANT
TRENDGUARD 450 HYBRID PRO PACK (MISCELLANEOUS) ×2
TROCAR PORT AIRSEAL 8X120 (TROCAR) ×2 IMPLANT
WATER STERILE IRR 1000ML POUR (IV SOLUTION) ×2 IMPLANT

## 2019-08-28 NOTE — Anesthesia Procedure Notes (Addendum)
Procedure Name: Intubation Date/Time: 08/28/2019 7:38 AM Performed by: Bonney Aid, CRNA Pre-anesthesia Checklist: Patient identified, Emergency Drugs available, Suction available and Patient being monitored Patient Re-evaluated:Patient Re-evaluated prior to induction Oxygen Delivery Method: Circle system utilized Preoxygenation: Pre-oxygenation with 100% oxygen Induction Type: IV induction Ventilation: Mask ventilation without difficulty Laryngoscope Size: Mac and 3 Grade View: Grade II Tube type: Oral Tube size: 7.0 mm Number of attempts: 1 Airway Equipment and Method: Stylet Placement Confirmation: ETT inserted through vocal cords under direct vision,  positive ETCO2 and breath sounds checked- equal and bilateral Secured at: 21 cm Tube secured with: Tape Dental Injury: Teeth and Oropharynx as per pre-operative assessment

## 2019-08-28 NOTE — Brief Op Note (Signed)
08/28/2019  9:57 AM  PATIENT:  Lennox Solders  48 y.o. female  PRE-OPERATIVE DIAGNOSIS:  Symptomatic Uterine Fibroid  POST-OPERATIVE DIAGNOSIS:  Symptomatic Uterine Fibroid  PROCEDURE:  Da vinci robotic total hysterectomy, bilateral salpingectomy  SURGEON:  Surgeon(s) and Role:    * Servando Salina, MD - Primary  PHYSICIAN ASSISTANT:   ASSISTANTS: Gaylord Shih, RNFA   ANESTHESIA:   general Findings: multiple uterine fibroids, nl tubes and ovaries, nl appendix. No endometriosis EBL:  25 mL   BLOOD ADMINISTERED:none  DRAINS: none   LOCAL MEDICATIONS USED:  MARCAINE     SPECIMEN:  Source of Specimen:  uterus with cervix, tubes  DISPOSITION OF SPECIMEN:  PATHOLOGY  COUNTS:  YES  TOURNIQUET:  * No tourniquets in log *  DICTATION: .Other Dictation: Dictation Number [id number]  PLAN OF CARE: Admit for overnight observation  PATIENT DISPOSITION:  PACU - hemodynamically stable.   Delay start of Pharmacological VTE agent (>24hrs) due to surgical blood loss or risk of bleeding: no

## 2019-08-28 NOTE — Transfer of Care (Signed)
Immediate Anesthesia Transfer of Care Note  Patient: [name]  Procedure(s) Performed: XI ROBOTIC ASSISTED LAPAROSCOPIC HYSTERECTOMY AND SALPINGECTOMY (Bilateral )  Patient Location: PACU  Anesthesia Type:General  Level of Consciousness: unresponsive  Airway & Oxygen Therapy: Patient Spontanous Breathing and Patient connected to nasal cannula oxygen  Post-op Assessment: Report given to RN  Post vital signs: Reviewed and stable  Last Vitals:  Vitals Value Taken Time  BP 150/69 08/28/19 0958  Temp    Pulse 90 08/28/19 1000  Resp 18 08/28/19 1000  SpO2 100 % 08/28/19 1000  Vitals shown include unvalidated device data.  Last Pain:  Vitals:   08/28/19 0551  TempSrc: Oral  PainSc: 0-No pain      Patients Stated Pain Goal: 5 ([id number])  Complications: No apparent anesthesia complications

## 2019-08-28 NOTE — Anesthesia Postprocedure Evaluation (Signed)
Anesthesia Post Note  Patient: [name]  Procedure(s) Performed: XI ROBOTIC ASSISTED LAPAROSCOPIC HYSTERECTOMY AND SALPINGECTOMY (Bilateral )     Patient location during evaluation: PACU Anesthesia Type: General Level of consciousness: awake and alert Pain management: pain level controlled Vital Signs Assessment: post-procedure vital signs reviewed and stable Respiratory status: spontaneous breathing, nonlabored ventilation and respiratory function stable Cardiovascular status: blood pressure returned to baseline and stable Postop Assessment: no apparent nausea or vomiting Anesthetic complications: no    Last Vitals:  Vitals:   08/28/19 1100 08/28/19 1130  BP: 137/77 (!) 148/79  Pulse: 67 62  Resp: 16 16  Temp: (!) 36.3 C (!) 36.4 C  SpO2: 98% 100%    Last Pain:  Vitals:   08/28/19 1120  TempSrc:   PainSc: 7                  Compton Brigance E Renne Platts

## 2019-08-29 ENCOUNTER — Encounter (HOSPITAL_BASED_OUTPATIENT_CLINIC_OR_DEPARTMENT_OTHER): Payer: Self-pay | Admitting: Obstetrics and Gynecology

## 2019-08-29 DIAGNOSIS — D259 Leiomyoma of uterus, unspecified: Secondary | ICD-10-CM | POA: Diagnosis not present

## 2019-08-29 LAB — BASIC METABOLIC PANEL
Anion gap: 9 (ref 5–15)
BUN: 8 mg/dL (ref 6–20)
CO2: 27 mmol/L (ref 22–32)
Calcium: 9.2 mg/dL (ref 8.9–10.3)
Chloride: 103 mmol/L (ref 98–111)
Creatinine, Ser: 0.76 mg/dL (ref 0.44–1.00)
GFR calc Af Amer: >60 mL/min (ref >60)
GFR calc non Af Amer: >60 mL/min (ref >60)
Glucose, Bld: 104 mg/dL — ABNORMAL HIGH (ref 70–99)
Potassium: 3.7 mmol/L (ref 3.5–5.1)
Sodium: 139 mmol/L (ref 135–145)

## 2019-08-29 LAB — CBC
HCT: 33 % — ABNORMAL LOW (ref 36.0–46.0)
Hemoglobin: 10 g/dL — ABNORMAL LOW (ref 12.0–15.0)
MCH: 25.9 pg — ABNORMAL LOW (ref 26.0–34.0)
MCHC: 30.3 g/dL (ref 30.0–36.0)
MCV: 85.5 fL (ref 80.0–100.0)
Platelets: 353 10*3/uL (ref 150–400)
RBC: 3.86 MIL/uL — ABNORMAL LOW (ref 3.87–5.11)
RDW: 13.8 % (ref 11.5–15.5)
WBC: 13.2 10*3/uL — ABNORMAL HIGH (ref 4.0–10.5)
nRBC: 0 % (ref 0.0–0.2)

## 2019-08-29 MED ORDER — IBUPROFEN 800 MG PO TABS: 800.0000 mg | ORAL_TABLET | Freq: Four times a day (QID) | ORAL | 6 refills | Status: DC

## 2019-08-29 MED ORDER — KETOROLAC TROMETHAMINE 30 MG/ML IJ SOLN
INTRAMUSCULAR | Status: AC
  Filled 2019-08-29: qty 1

## 2019-08-29 MED ORDER — ACETAMINOPHEN 500 MG PO TABS
ORAL_TABLET | ORAL | Status: AC
  Filled 2019-08-29: qty 2

## 2019-08-29 MED ORDER — OXYCODONE HCL 5 MG PO TABS: 5.0000 mg | ORAL_TABLET | ORAL | 0 refills | Status: AC | PRN

## 2019-08-29 NOTE — Discharge Instructions (Signed)
Call if temperature greater than equal to 100.4, nothing per vagina for 4-6 weeks or severe nausea vomiting, increased incisional pain , drainage or redness in the incision site, no straining with bowel movements, showers no bath °

## 2019-08-29 NOTE — Discharge Summary (Signed)
Physician Discharge Summary  Patient ID: CATALINA STRAHM MRN: [id number] DOB/AGE: April 10, 1971 48 y.o.  Admit date: 08/28/2019 Discharge date: 08/29/2019  Admission Diagnoses: symptomatic uterine fibroids  Discharge Diagnoses: same Active Problems:   Uterine fibroid   Status post total hysterectomy chronic HTN  Discharged Condition: stable  Hospital Course:  Pt underwent da vinci robotic total hysterectomy, bilateral salpingectomy. Postop course unremarkable  Consults: None  Significant Diagnostic Studies: labs:  CBC Latest Ref Rng & Units 08/29/2019 08/22/2019 04/06/2017  WBC 4.0 - 10.5 K/uL 13.2(H) 5.7 4.2  Hemoglobin 12.0 - 15.0 g/dL 10.0(L) 11.1(L) 11.3(L)  Hematocrit 36.0 - 46.0 % 33.0(L) 36.2 35.4(L)  Platelets 150 - 400 K/uL 353 386 295   CMP Latest Ref Rng & Units 08/29/2019 08/22/2019 04/06/2017  Glucose 70 - 99 mg/dL 104(H) 87 89  BUN 6 - 20 mg/dL 8 11 11   Creatinine 0.44 - 1.00 mg/dL 0.76 0.72 0.69  Sodium 135 - 145 mmol/L 139 138 138  Potassium 3.5 - 5.1 mmol/L 3.7 3.6 3.3(L)  Chloride 98 - 111 mmol/L 103 103 104  CO2 22 - 32 mmol/L 27 27 25   Calcium 8.9 - 10.3 mg/dL 9.2 9.0 9.3  Total Protein 6.5 - 8.1 g/dL - - -  Total Bilirubin 0.3 - 1.2 mg/dL - - -  Alkaline Phos 38 - 126 U/L - - -  AST 15 - 41 U/L - - -  ALT 14 - 54 U/L - - -     Treatments: surgery: davinci robotic total hysterectomy, bilateral salpingectomy  Discharge Exam: Blood pressure 136/67, pulse 67, temperature 98.7 F (37.1 C), resp. rate 14, height 5\' 4"  (1.626 m), weight 75.9 kg, last menstrual period 08/20/2019, SpO2 100 %. General appearance: alert, cooperative and no distress Resp: clear to auscultation bilaterally Cardio: regular rate and rhythm, S1, S2 normal, no murmur, click, rub or gallop GI: soft, non-tender; bowel sounds normal; no masses,  no organomegaly Pelvic: deferred Extremities: no edema, redness or tenderness in the calves or thighs Incision/Wound: dry clean intact  Disposition:  Discharge disposition: 01-Home or Self Care       Discharge Instructions    Call MD for:  redness, tenderness, or signs of infection (pain, swelling, redness, odor or green/yellow discharge around incision site)   Complete by: As directed    Call MD for:  severe uncontrolled pain   Complete by: As directed    Call MD for:  temperature >100.4   Complete by: As directed    Diet - low sodium heart healthy   Complete by: As directed    Discharge instructions   Complete by: As directed    Call if temperature greater than equal to 100.4, nothing per vagina for 4-6 weeks or severe nausea vomiting, increased incisional pain , drainage or redness in the incision site, no straining with bowel movements, showers no bath   May walk up steps   Complete by: As directed      Allergies as of 08/29/2019   No Known Allergies     Medication List    TAKE these medications   cholecalciferol 25 MCG (1000 UT) tablet Commonly known as: VITAMIN D3 Take 1,000 Units by mouth daily.   fluticasone 50 MCG/ACT nasal spray Commonly known as: FLONASE Place 2 sprays into both nostrils daily.   hydrochlorothiazide 12.5 MG tablet Commonly known as: HYDRODIURIL Take 12.5 mg by mouth every evening.   ibuprofen 800 MG tablet Commonly known as: ADVIL Take 1 tablet (800 mg total) by  mouth every 6 (six) hours. What changed:   medication strength  how much to take  when to take this  reasons to take this   loratadine 10 MG tablet Commonly known as: CLARITIN Take 10 mg by mouth daily.   metFORMIN 500 MG 24 hr tablet Commonly known as: GLUCOPHAGE-XR Take 1,500 mg by mouth at bedtime.   oxyCODONE 5 MG immediate release tablet Commonly known as: Oxy IR/ROXICODONE Take 1-2 tablets (5-10 mg total) by mouth every 4 (four) hours as needed for up to 5 days for moderate pain.   Red Yeast Rice 600 MG Caps Take 600 mg by mouth 2 (two) times daily.   SUPER B COMPLEX PO Take 2 each by mouth daily.  Chewables      Follow-up Information    Servando Salina, MD Follow up in 2 week(s).   Specialty: Obstetrics and Gynecology Contact information: [address] [place] [address] [phone]           Signed: Alanda Slim A Breandan People 08/29/2019, 7:04 AM

## 2019-08-29 NOTE — Progress Notes (Signed)
Subjective: Patient reports tolerating PO not voided as yet. Marland Kitchen  Has not taken any  Narcotic pain med  Objective: I have reviewed patient's vital signs.  vital signs, intake and output and labs. Vitals:   08/29/19 0100 08/29/19 0429  BP: 124/66 136/67  Pulse: 66 67  Resp: 16 14  Temp: 99.1 F (37.3 C) 98.7 F (37.1 C)  SpO2: 99% 100%   I/O last 3 completed shifts: In: [id number] [P.O.:680; I.V.:1000; IV Piggyback:100] Out: 1050 [Urine:1025; Blood:25] Total I/O In: -  Out: 1250 [Urine:1250]  Lab Results  Component Value Date   WBC 13.2 (H) 08/29/2019   HGB 10.0 (L) 08/29/2019   HCT 33.0 (L) 08/29/2019   MCV 85.5 08/29/2019   PLT 353 08/29/2019   Lab Results  Component Value Date   CREATININE 0.76 08/29/2019    EXAM General: alert, cooperative and no distress Resp: clear to auscultation bilaterally Cardio: regular rate and rhythm, S1, S2 normal, no murmur, click, rub or gallop GI: soft, non-tender; bowel sounds normal; no masses,  no organomegaly and incision: clean, dry and intact Extremities: no edema, redness or tenderness in the calves or thighs Vaginal Bleeding: minimal brown  Assessment: s/p Procedure(s): XI ROBOTIC ASSISTED LAPAROSCOPIC HYSTERECTOMY AND SALPINGECTOMY: stable, progressing well and tolerating diet  Plan: Advance diet Advance to PO medication Discontinue IV fluids Discharge home after  Void F/u 2 wk D/c instructions reviewed  LOS: 0 days    Marvene Staff, MD 08/29/2019 6:13 AM    08/29/2019, 6:13 AM

## 2019-08-29 NOTE — Op Note (Signed)
NAMEKIMOTHY, [name] MEDICAL RECORD [id number] ACCOUNT [id number] DATE OF BIRTH:Nov 18, 1971 FACILITY: [facility] LOCATION: [facility] PHYSICIAN:Jeniyah Menor A. Milt Coye, MD  OPERATIVE REPORT  DATE OF PROCEDURE:  08/28/2019  PREOPERATIVE DIAGNOSIS:  Symptomatic uterine fibroids.  PROCEDURE:  DaVinci robotic total hysterectomy, bilateral salpingectomy.  POSTOPERATIVE DIAGNOSIS:  Symptomatic uterine fibroids.  ANESTHESIA:  General.  SURGEON:  Servando Salina, MD  ASSISTANT: Gaylord Shih, RNFA  DESCRIPTION OF PROCEDURE:  Under adequate general anesthesia, the patient was placed in the dorsal lithotomy position.  She was positioned for robotic surgery.  She was sterilely prepped and draped in usual fashion.  A 3-way Foley catheter was sterilely  placed.  Examination under anesthesia revealed a retroverted 12- to 13-week size uterus.  No adnexal masses could be appreciated.  A weighted speculum was placed in the vagina.  A Sims retractor was placed anteriorly.  The cervix was parous.  0 Vicryl  figure-of-eight sutures were placed on the anterior and posterior lip of the cervix.  The uterus was sounded to 10 cm.  A 10 mm uterine manipulator along with a medium RUMI cup were inserted into the uterine cavity without difficulty.  The retractors  were removed.  Attention was then turned to the abdomen.  A supraumbilical incision was made after 0.25% Marcaine was injected.  A Veress needle was introduced and tested with sterile water.  Opening pressure of 5 was noted.  Four liters of CO2 was  insufflated.  Veress needle was then removed.  A robotic 8 mm trocar was introduced into the abdomen without incident.  A lighted video laparoscope was then inserted confirming entry into the abdomen without any untoward effect.  Panoramic inspection  noted normal liver edge.  The patient was placed in Trendelenburg position.  A large fibroid uterus was noted.  The additional port sites were placed, 2 on the right 8  mm apart, 1 on the far distal left, and the 8 mm vessel sealer was then placed.  The robot was then docked.  In arm #1, the vessel sealer was placed.  On #3 was the long bipolar forceps, and in arm #4, the monopolar scissors.  Arm 2 was the camera port that was placed.  I then went to the surgical console.  At the surgical console, both  ureters were identified easily peristalsing in the field.  Both fallopian tubes and ovaries were noted to be normal.  The procedure was started on the left with the left fallopian tube being grasped.  The underlying mesosalpinx was serially clamped, cauterized, and then cut and removed.  The left retroperitoneal space was then opened.  A window was made in the medial part of the broad ligament, and the left utero-ovarian ligament was serially clamped, cauterized, and then cut.  The posterior leaf of  the broad ligament was further dissected inferiorly.  The round ligament was then clamped, cauterized, and cut.  The anterior leaf of the broad ligament was opened, carried around anteriorly to the vesicouterine.  The peritoneum was opened transversely, and the bladder was sharply dissected off the lower uterine segment and displaced inferiorly.  Uterine vessels were then skeletonized.  Uterine vessels were then clamped, cauterized, and cut.  Attention was then turned to the opposite side where the  same procedure was performed, starting with the round ligament as well.  The uterine edges were identified, serially clamped, cauterized, and cut.  The vaginal insufflator was then placed.  The bladder was further displaced inferiorly at the cervicovaginal junction.  The incision  was then made and carried at the superior aspect of the RUMI cup circumferentially with subsequent detachment of the uterus from the vagina.  The specimen was then removed.  The vaginal cuff with some bleeding,  which was cauterized.  Instruments were then swapped out with the vessel sealer being replaced by the  long-tip forceps and the monopolar scissors being replaced by large mega suture needle driver.  A 0 V-Loc suture was then placed, and the vaginal cuff, after being certain that it was hemostased and the bladder was displaced inferiorly, was then closed with 0 V-Loc suture.  Small bleeding was cauterized.  The adnexal pedicles were with good hemostasis.  The opening pressure was dropped to 8 to identify  for any bleeders.  No bleeders were identified.  The abdomen and pelvis were irrigated and suctioned of debris with good hemostasis noted.  Arista potato starch was then placed overlying the vaginal cuff.  The procedure was then felt to be complete.  The ureters were identified peristalsing actively on both sides.  The robotic instruments were then removed.  The robot was then undocked.  The robotic port sites were then removed, abdomen deflated.  I went back to the patient's bedside sterilely, and the incisions were then closed with 4-0 Vicryl subcuticular closures.  The vaginal cuff was inspected x2 intraoperatively and closed at the end of the surgery with good approximation noted.  SPECIMEN:  Uterus with cervix and fallopian tubes sent to pathology.  ESTIMATED BLOOD LOSS:  25 mL.  INTRAOPERATIVE FLUIDS:  900 mL.  URINE OUTPUT:  600 mL clear yellow urine.  COUNTS:  Sponge and instrument count x2 was correct.  COMPLICATIONS:  None.  DISPOSITION:  The patient tolerated the procedure well and was transferred to recovery room in stable condition.  LN/NUANCE  D:08/28/2019 T:08/29/2019 JOB:[id number]

## 2019-09-02 ENCOUNTER — Encounter (HOSPITAL_BASED_OUTPATIENT_CLINIC_OR_DEPARTMENT_OTHER): Payer: Self-pay | Admitting: Obstetrics and Gynecology

## 2020-02-27 ENCOUNTER — Other Ambulatory Visit: Payer: Self-pay

## 2020-02-27 ENCOUNTER — Ambulatory Visit: Payer: 59 | Attending: Internal Medicine

## 2020-02-27 DIAGNOSIS — Z23 Encounter for immunization: Secondary | ICD-10-CM

## 2020-02-27 NOTE — Progress Notes (Signed)
   Covid-19 Vaccination Clinic  Name:  [name]    MRN: [id number] DOB: 01/19/1971  02/27/2020  [name] was observed post Covid-19 immunization for 15 minutes without incident. She was provided with Vaccine Information Sheet and instruction to access the V-Safe system.   [name] was instructed to call 911 with any severe reactions post vaccine: Marland Kitchen Difficulty breathing  . Swelling of face and throat  . A fast heartbeat  . A bad rash all over body  . Dizziness and weakness

## 2020-03-30 ENCOUNTER — Ambulatory Visit: Payer: 59 | Attending: Internal Medicine

## 2020-03-30 DIAGNOSIS — Z23 Encounter for immunization: Secondary | ICD-10-CM

## 2020-03-30 NOTE — Progress Notes (Signed)
   Covid-19 Vaccination Clinic  Name:  [name]    MRN: [id number] DOB: Nov 02, 1971  03/30/2020  [name] was observed post Covid-19 immunization for 15 minutes without incident. She was provided with Vaccine Information Sheet and instruction to access the V-Safe system.   [name] was instructed to call 911 with any severe reactions post vaccine: Marland Kitchen Difficulty breathing  . Swelling of face and throat  . A fast heartbeat  . A bad rash all over body  . Dizziness and weakness   Immunizations Administered    Name Date Dose VIS Date Route   Pfizer COVID-19 Vaccine 03/30/2020  3:49 PM 0.3 mL 12/05/2019 Intramuscular   Manufacturer: [place]   Lot: [id number]   [place]: [id number]

## 2020-07-21 ENCOUNTER — Emergency Department (HOSPITAL_COMMUNITY)
Admission: EM | Admit: 2020-07-21 | Discharge: 2020-07-21 | Disposition: A | Discharge status: Home / Self Care | Payer: 59 | Attending: Emergency Medicine | Admitting: Emergency Medicine

## 2020-07-21 ENCOUNTER — Other Ambulatory Visit: Payer: Self-pay

## 2020-07-21 ENCOUNTER — Emergency Department (HOSPITAL_COMMUNITY): Payer: 59

## 2020-07-21 ENCOUNTER — Inpatient Hospital Stay
Admission: RE | Admit: 2020-07-21 | Discharge: 2020-07-21 | Disposition: A | Discharge status: Home / Self Care | Payer: 59 | Source: Ambulatory Visit

## 2020-07-21 DIAGNOSIS — I10 Essential (primary) hypertension: Secondary | ICD-10-CM | POA: Insufficient documentation

## 2020-07-21 DIAGNOSIS — Z79899 Other long term (current) drug therapy: Secondary | ICD-10-CM | POA: Diagnosis not present

## 2020-07-21 DIAGNOSIS — R1032 Left lower quadrant pain: Secondary | ICD-10-CM | POA: Insufficient documentation

## 2020-07-21 DIAGNOSIS — R109 Unspecified abdominal pain: Secondary | ICD-10-CM

## 2020-07-21 LAB — COMPREHENSIVE METABOLIC PANEL
ALT: 23 U/L (ref 0–44)
AST: 23 U/L (ref 15–41)
Albumin: 4.4 g/dL (ref 3.5–5.0)
Alkaline Phosphatase: 51 U/L (ref 38–126)
Anion gap: 15 (ref 5–15)
BUN: 18 mg/dL (ref 6–20)
CO2: 19 mmol/L — ABNORMAL LOW (ref 22–32)
Calcium: 10.1 mg/dL (ref 8.9–10.3)
Chloride: 99 mmol/L (ref 98–111)
Creatinine, Ser: 0.74 mg/dL (ref 0.44–1.00)
GFR calc Af Amer: >60 mL/min (ref >60)
GFR calc non Af Amer: >60 mL/min (ref >60)
Glucose, Bld: 124 mg/dL — ABNORMAL HIGH (ref 70–99)
Potassium: 3.4 mmol/L — ABNORMAL LOW (ref 3.5–5.1)
Sodium: 133 mmol/L — ABNORMAL LOW (ref 135–145)
Total Bilirubin: 0.7 mg/dL (ref 0.3–1.2)
Total Protein: 7.7 g/dL (ref 6.5–8.1)

## 2020-07-21 LAB — I-STAT BETA HCG BLOOD, ED (MC, WL, AP ONLY): I-stat hCG, quantitative: <5 m[IU]/mL (ref <5)

## 2020-07-21 LAB — CBC
HCT: 42.4 % (ref 36.0–46.0)
Hemoglobin: 13.7 g/dL (ref 12.0–15.0)
MCH: 27.6 pg (ref 26.0–34.0)
MCHC: 32.3 g/dL (ref 30.0–36.0)
MCV: 85.5 fL (ref 80.0–100.0)
Platelets: 299 10*3/uL (ref 150–400)
RBC: 4.96 MIL/uL (ref 3.87–5.11)
RDW: 13.4 % (ref 11.5–15.5)
WBC: 8.4 10*3/uL (ref 4.0–10.5)
nRBC: 0 % (ref 0.0–0.2)

## 2020-07-21 LAB — URINALYSIS, ROUTINE W REFLEX MICROSCOPIC
Bilirubin Urine: NEGATIVE
Glucose, UA: NEGATIVE mg/dL
Hgb urine dipstick: NEGATIVE
Ketones, ur: 20 mg/dL — AB
Leukocytes,Ua: NEGATIVE
Nitrite: NEGATIVE
Protein, ur: NEGATIVE mg/dL
Specific Gravity, Urine: 1.025 (ref 1.005–1.030)
pH: 5 (ref 5.0–8.0)

## 2020-07-21 LAB — LIPASE, BLOOD: Lipase: 28 U/L (ref 11–51)

## 2020-07-21 MED ORDER — HYDROMORPHONE HCL 1 MG/ML IJ SOLN
0.5000 mg | Freq: Once | INTRAMUSCULAR | Status: AC
  Administered 2020-07-21: 0.5 mg via INTRAVENOUS
  Filled 2020-07-21: qty 1

## 2020-07-21 MED ORDER — ONDANSETRON HCL 4 MG/2ML IJ SOLN
4.0000 mg | Freq: Once | INTRAMUSCULAR | Status: AC
  Administered 2020-07-21: 4 mg via INTRAVENOUS
  Filled 2020-07-21: qty 2

## 2020-07-21 MED ORDER — SODIUM CHLORIDE 0.9% FLUSH
3.0000 mL | Freq: Once | INTRAVENOUS | Status: AC
  Administered 2020-07-21: 3 mL via INTRAVENOUS

## 2020-07-21 MED ORDER — OXYCODONE-ACETAMINOPHEN 5-325 MG PO TABS: 1.0000 | ORAL_TABLET | Freq: Four times a day (QID) | ORAL | 0 refills | Status: DC | PRN

## 2020-07-21 MED ORDER — HYDROMORPHONE HCL 1 MG/ML IJ SOLN
1.0000 mg | Freq: Once | INTRAMUSCULAR | Status: AC
  Administered 2020-07-21: 1 mg via INTRAVENOUS
  Filled 2020-07-21: qty 1

## 2020-07-21 MED ORDER — ONDANSETRON HCL 4 MG/2ML IJ SOLN: 4.0000 mg | Freq: Once | INTRAMUSCULAR | Status: DC | PRN

## 2020-07-21 MED ORDER — NAPROXEN 500 MG PO TABS: 500.0000 mg | ORAL_TABLET | Freq: Two times a day (BID) | ORAL | 0 refills | Status: DC

## 2020-07-21 NOTE — ED Provider Notes (Signed)
Patient placed in [facility] pathway, seen and evaluated   Chief Complaint: left sided abdominal and flank pain  HPI: [name] is a 49 y.o. female who presents with left lower abdominal pain and left-sided flank pain.  She states that pain began this morning, and was initially more mild and intermittent but since 11 AM pain has gotten significantly worse.  She states it comes in waves where it seems to get more severe and she cannot get comfortable or get any relief.  Pain radiates from her lower abdomen back towards her left flank.  She has never had similar pain in this location, did have similarly severe pain when she had to have her gallbladder removed.  Denies fevers, has had nausea and vomiting.  ROS: + Abdominal pain, nausea, vomiting  -Fevers  Physical Exam:   Gen: No distress, appears uncomfortable  Neuro: Awake and Alert  Skin: Warm    Focused Exam: Pain and tenderness to palpation over the left side of the abdomen and left flank, pain is not made significantly worse with palpation and there is no guarding or peritoneal signs.  There is left-sided CVA tenderness.   Initiation of care has begun. The patient has been counseled on the process, plan, and necessity for staying for the completion/evaluation, and the remainder of the medical screening examination  Concern for potential kidney stone stone, abdominal labs ordered as well as CT renal stone study, pain and nausea medications ordered.   Jacqlyn Larsen, PA-C 07/21/20 1425    Elnora Morrison, MD 07/21/20 [phone]

## 2020-07-21 NOTE — ED Triage Notes (Signed)
C/O severe LLQ abdominal pain that radiates to the back,

## 2020-07-21 NOTE — ED Provider Notes (Signed)
[facility] EMERGENCY DEPARTMENT Provider Note   CSN: [id number] Arrival date & time: 07/21/20  1343     History Chief Complaint  Patient presents with  . Abdominal Pain    [name] is a 49 y.o. female.  Patient is a 49 year old female with past medical history of hypertension, hyperlipidemia, polycystic ovaries presenting with complaints of left lower quadrant pain.  This began earlier today and rapidly worsening.  The pain radiates to her left flank with no aggravating or alleviating factors.  She denies any bowel or bladder complaints.  She denies any fevers or chills.  She denies any vaginal bleeding.  The history is provided by the patient.       Past Medical History:  Diagnosis Date  . Allergy    takes Claritin and uses Flonase daily  . History of bronchitis as a child   . Hyperlipidemia    not on any meds  . Hypertension    takes HCTZ daily  . PCOS (polycystic ovarian syndrome)     Patient Active Problem List   Diagnosis Date Noted  . Uterine fibroid 08/28/2019  . Status post total hysterectomy 08/28/2019    Past Surgical History:  Procedure Laterality Date  . CERVICAL CERCLAGE    . CHOLECYSTECTOMY N/A 04/13/2017   Procedure: LAPAROSCOPIC CHOLECYSTECTOMY;  Surgeon: Clovis Riley, MD;  Location: [facility];  Service: General;  Laterality: N/A;  . COLONOSCOPY    . cyst removed from ovary    . DILATION AND CURETTAGE OF UTERUS    . NASAL SINUS SURGERY    . REFRACTIVE SURGERY    . ROBOTIC ASSISTED LAPAROSCOPIC HYSTERECTOMY AND SALPINGECTOMY Bilateral 08/28/2019   Procedure: XI ROBOTIC ASSISTED LAPAROSCOPIC HYSTERECTOMY AND SALPINGECTOMY;  Surgeon: Servando Salina, MD;  Location: [facility];  Service: Gynecology;  Laterality: Bilateral;  Gaylord Shih RNFA  . UTERINE ARTERY EMBOLIZATION    . wisdom teeth extracted       OB History   No obstetric history on file.     Family History  Problem Relation Age of Onset  .  Diabetes Father   . Hypertension Father   . Cancer Father   . Heart disease Father   . Cancer Other   . Sleep apnea Other     Social History   Tobacco Use  . Smoking status: Never Smoker  . Smokeless tobacco: Never Used  Vaping Use  . Vaping Use: Never used  Substance Use Topics  . Alcohol use: Yes    Comment: social  . Drug use: No    Home Medications Prior to Admission medications   Medication Sig Start Date End Date Taking? Authorizing Provider  Ascorbic Acid (VITAMIN C) 500 MG CAPS Take 1 tablet by mouth daily.   Yes [provider]  B Complex-C (SUPER B COMPLEX PO) Take 2 each by mouth daily. Chewables   Yes [provider]  bismuth subsalicylate (PEPTO BISMOL) 262 MG chewable tablet Chew 524 mg by mouth daily as needed for indigestion.   Yes [provider]  ezetimibe (ZETIA) 10 MG tablet Take 1 tablet by mouth daily. 04/26/20  Yes [provider]  hydrochlorothiazide (HYDRODIURIL) 12.5 MG tablet Take 12.5 mg by mouth every evening.    Yes [provider]  ibuprofen (ADVIL) 800 MG tablet Take 1 tablet (800 mg total) by mouth every 6 (six) hours. Patient taking differently: Take 800 mg by mouth every 6 (six) hours as needed for moderate pain.  08/29/19  Yes Servando Salina, MD  loratadine (CLARITIN) 10 MG tablet Take 10 mg by mouth daily.   Yes [provider]  metFORMIN (GLUCOPHAGE-XR) 500 MG 24 hr tablet Take 1,000 mg by mouth at bedtime.    Yes [provider]  Multiple Vitamins-Minerals (ADULT GUMMY) CHEW Chew 1 tablet by mouth daily.   Yes [provider]  Probiotic Product (ALIGN PO) Take 1 tablet by mouth daily.   Yes [provider]    Allergies    Patient has no known allergies.  Review of Systems   Review of Systems  Gastrointestinal: Positive for abdominal pain.  All other systems reviewed and are negative.   Physical Exam Updated Vital Signs BP (!) 115/60 (BP Location:  Right Arm)   Pulse 63   Temp 98.6 F (37 C) (Oral)   Resp 18   Ht 5\' 4"  (1.626 m)   Wt 70.8 kg   SpO2 100%   BMI 26.78 kg/m   Physical Exam Vitals and nursing note reviewed.  Constitutional:      General: She is not in acute distress.    Appearance: She is well-developed. She is not diaphoretic.  HENT:     Head: Normocephalic and atraumatic.  Cardiovascular:     Rate and Rhythm: Normal rate and regular rhythm.     Heart sounds: No murmur heard.  No friction rub. No gallop.   Pulmonary:     Effort: Pulmonary effort is normal. No respiratory distress.     Breath sounds: Normal breath sounds. No wheezing.  Abdominal:     General: Bowel sounds are normal. There is no distension.     Palpations: Abdomen is soft.     Tenderness: There is abdominal tenderness in the left lower quadrant. There is left CVA tenderness. There is no guarding or rebound.     Comments: There is mild left-sided CVA and left lower quadrant tenderness.  Musculoskeletal:        General: Normal range of motion.     Cervical back: Normal range of motion and neck supple.  Skin:    General: Skin is warm and dry.  Neurological:     Mental Status: She is alert and oriented to person, place, and time.     ED Results / Procedures / Treatments   Labs (all labs ordered are listed, but only abnormal results are displayed) Labs Reviewed  COMPREHENSIVE METABOLIC PANEL - Abnormal; Notable for the following components:      Result Value   Sodium 133 (*)    Potassium 3.4 (*)    CO2 19 (*)    Glucose, Bld 124 (*)    All other components within normal limits  LIPASE, BLOOD  CBC  URINALYSIS, ROUTINE W REFLEX MICROSCOPIC  I-STAT BETA HCG BLOOD, ED ([facility], [facility], [facility] ONLY)    EKG None  Radiology CT Renal Stone Study  Result Date: 07/21/2020 CLINICAL DATA:  Left flank pain. EXAM: CT ABDOMEN AND PELVIS WITHOUT CONTRAST TECHNIQUE: Multidetector CT imaging of the abdomen and pelvis was performed following the standard  protocol without IV contrast. COMPARISON:  None. FINDINGS: Lower chest: The lung bases are clear. Hepatobiliary: No focal liver abnormality is seen. Status post cholecystectomy. No biliary dilatation. No ductal dilatation or inflammation. Pancreas: No ductal dilatation or inflammation. Spleen: Normal in size without focal abnormality. Adrenals/Urinary Tract: No adrenal nodule. No hydronephrosis of either kidney. No significant perinephric edema. No renal calculi. Contour deformity of the upper right kidney with rounded homogeneous  isodense lesion measuring 5.3 cm, incompletely characterized without contrast. No obvious left renal lesion on noncontrast exam. Both ureters are decompressed without stones along the course. The urinary bladder is minimally distended. No bladder stone or wall thickening. Stomach/Bowel: Bowel evaluation is limited in the absence of enteric contrast and paucity of intra-abdominal fat. Stomach is minimally distended, grossly unremarkable. Normal positioning of the duodenum and ligament of Treitz. There is no small bowel obstruction or evidence of inflammation. Normal appendix, for example series 6, image 66. Moderate volume of stool throughout the colon. Mild sigmoid colonic redundancy. There is no colonic wall thickening or inflammatory change. Vascular/Lymphatic: Mild aortic atherosclerosis. No aortic aneurysm. No bulky abdominopelvic adenopathy. Reproductive: Status post hysterectomy. The ovaries may remain, adnexa are not well evaluated in the absence of contrast. Question of small amount of pelvic free fluid. Other: No upper abdominal ascites.  No free air. Musculoskeletal: There are no acute or suspicious osseous abnormalities. IMPRESSION: 1. No renal stones or obstructive uropathy. 2. Contour deformity of the upper right kidney with rounded homogeneous isodense lesion measuring 5.3 cm, incompletely characterized without contrast. Recommend further evaluation with renal protocol MRI on  an elective basis. 3. Moderate colonic stool burden, can be seen with constipation. 4. Post hysterectomy. Ovaries may remain, however the adnexa are not well delineated on this noncontrast exam. Recommend correlation with surgical history. If patient has history of oophorectomy or there are symptoms referable to the pelvis, consider adnexal evaluation with ultrasound. Aortic Atherosclerosis (ICD10-I70.0). Electronically Signed   By: Keith Rake M.D.   On: 07/21/2020 15:45   US PELVIC COMPLETE W TRANSVAGINAL AND TORSION R/O  Result Date: 07/21/2020 CLINICAL DATA:  Left lower quadrant pain EXAM: TRANSABDOMINAL AND TRANSVAGINAL ULTRASOUND OF PELVIS DOPPLER ULTRASOUND OF OVARIES TECHNIQUE: Both transabdominal and transvaginal ultrasound examinations of the pelvis were performed. Transabdominal technique was performed for global imaging of the pelvis including uterus, ovaries, adnexal regions, and pelvic cul-de-sac. It was necessary to proceed with endovaginal exam following the transabdominal exam to visualize the adnexa and ovaries. Color and duplex Doppler ultrasound was utilized to evaluate blood flow to the ovaries. COMPARISON:  CT 07/21/2020 FINDINGS: Uterus Surgically absent Endometrium Surgically absent Right ovary Measurements: 3.2 x 2.5 x 2.7 cm = volume: 11.3 mL. Probable corpus luteum measuring 2.2 cm. Left ovary Measurements: 3.8 x 2.1 x 4 cm = volume: 17 mL. Normal appearance/no adnexal mass. There is fluid containing bowel adjacent to the left ovary. Pulsed Doppler evaluation of both ovaries demonstrates normal low-resistance arterial and venous waveforms. Other findings Small amount of free fluid. IMPRESSION: 1. Status post hysterectomy. 2. Negative for ovarian torsion 3. Small amount of free fluid in the pelvis Electronically Signed   By: Donavan Foil M.D.   On: 07/21/2020 19:58    Procedures Procedures (including critical care time)  Medications Ordered in ED Medications  ondansetron  (ZOFRAN) injection 4 mg (has no administration in time range)  sodium chloride flush (NS) 0.9 % injection 3 mL (3 mLs Intravenous Given 07/21/20 1443)  HYDROmorphone (DILAUDID) injection 1 mg (1 mg Intravenous Given 07/21/20 1445)  ondansetron (ZOFRAN) injection 4 mg (4 mg Intravenous Given 07/21/20 1445)  HYDROmorphone (DILAUDID) injection 0.5 mg (0.5 mg Intravenous Given 07/21/20 1743)    ED Course  I have reviewed the triage vital signs and the nursing notes.  Pertinent labs & imaging results that were available during my care of the patient were reviewed by me and considered in my medical decision making (see chart for  details).    MDM Rules/Calculators/A&P  Patient presenting with complaints of left-sided flank pain and left lower quadrant pain that started acutely yesterday.  Patient's vital signs are stable and she is afebrile.  There is no leukocytosis.  Her work-up also shows no evidence for renal calculus or ovarian torsion by CT/ultrasound.  Her urinalysis is clear.  I am uncertain as to the etiology of this patient's pain, however it appears to be a musculoskeletal etiology due to the absence of other pathology identified on her work-up.  Patient will be treated with anti-inflammatory medication, pain medicine, and return to the ER if her symptoms worsen.  She also has an abnormal finding in the upper right kidney for which an MRI is recommended.  This can be scheduled as an outpatient.  Patient notified of this finding and knows to arrange the study through her primary doctor.  Final Clinical Impression(s) / ED Diagnoses Final diagnoses:  LLQ abdominal pain    Rx / DC Orders ED Discharge Orders    None       Veryl Speak, MD 07/21/20 2151

## 2020-07-21 NOTE — Discharge Instructions (Addendum)
Begin taking naproxen as prescribed.  Take Percocet as prescribed as needed for pain not relieved with ibuprofen.  Follow-up with your primary doctor if symptoms or not improving in the next few days, and return to the ER if you develop high fever, worsening pain, bloody urine or stool, or other new and concerning symptoms.  There is also an abnormality in your right kidney identified on CT scan.  Radiology is recommending an outpatient MRI to further characterize this abnormality.  You should arrange this through your primary doctor in the next few weeks.

## 2020-08-25 ENCOUNTER — Other Ambulatory Visit: Payer: Self-pay

## 2020-09-06 IMAGING — CT CT RENAL STONE PROTOCOL
2 of 4 series · 16 of 46 positions shown, 18 images · non-contrast
Comparison: None.

CLINICAL DATA: Left flank pain.

EXAM:
CT ABDOMEN AND PELVIS WITHOUT CONTRAST
TECHNIQUE: Multidetector CT imaging of the abdomen and pelvis was performed
following the standard protocol without IV contrast.

[Series 3: a/p w/o 5mm · axial · non-contrast · 0.77mm/px · z∈[-190,+210]mm · 13 of 88 slices shown, 15 images]
[im 4/88  soft-tissue]
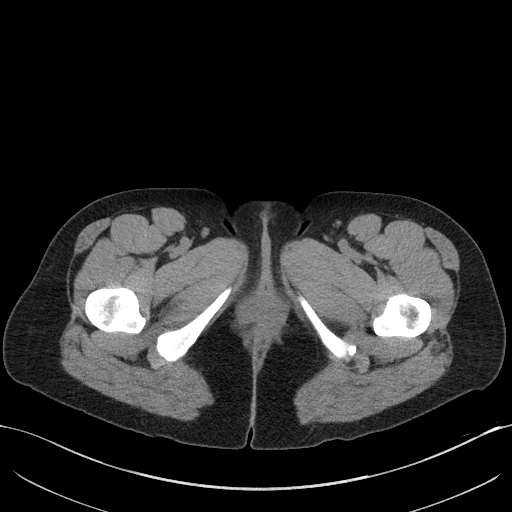
[im 4/88  bone]
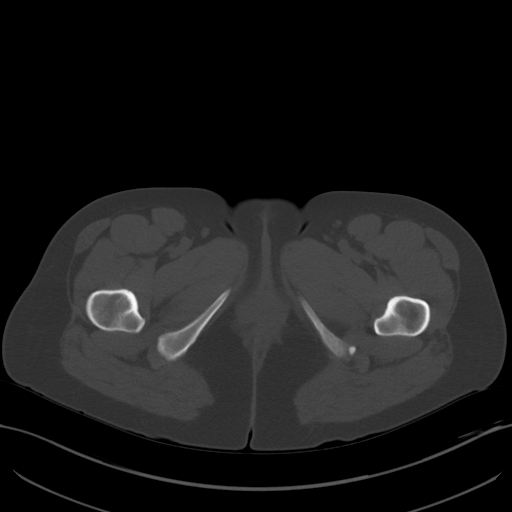
[im 11/88  soft-tissue]
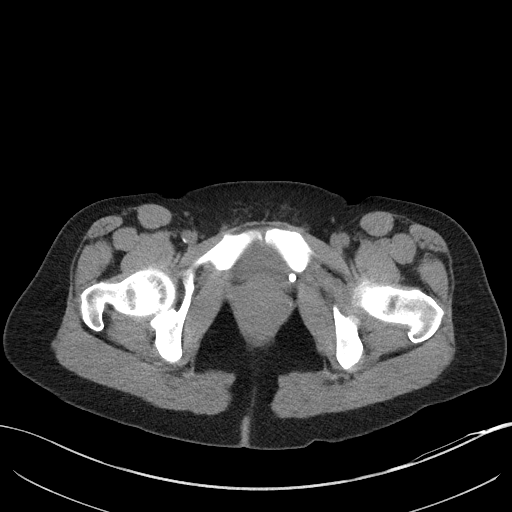
[im 18/88  soft-tissue]
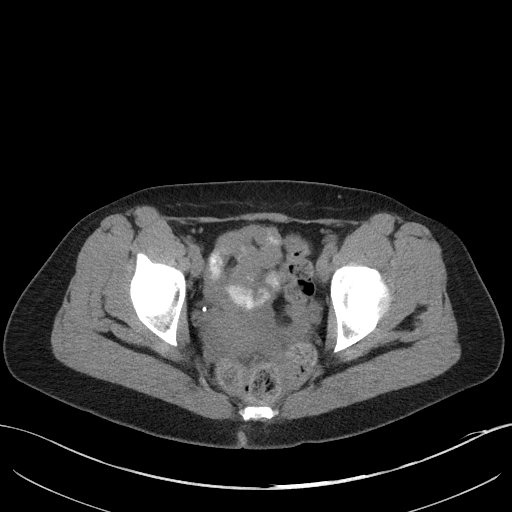
[im 25/88  soft-tissue]
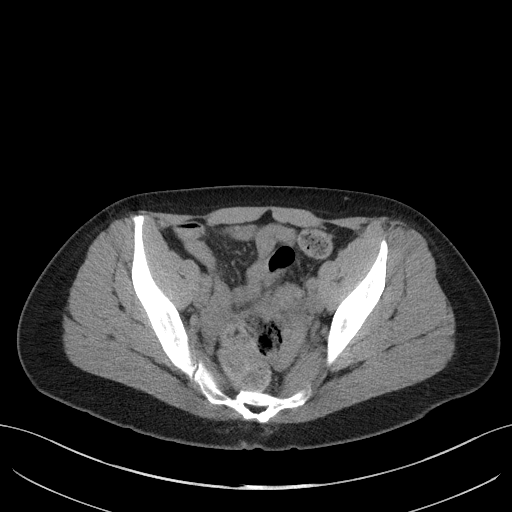
[im 32/88  soft-tissue]
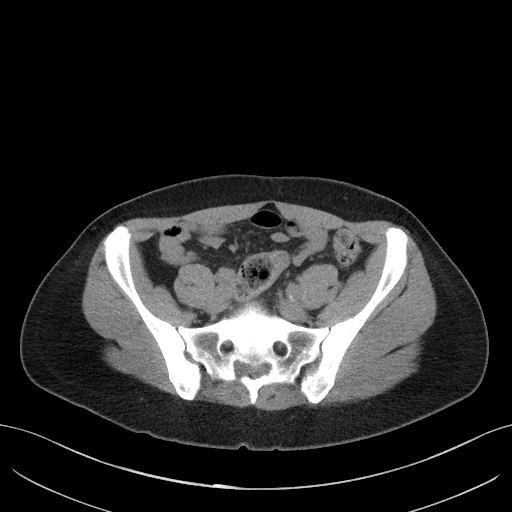
[im 39/88  soft-tissue]
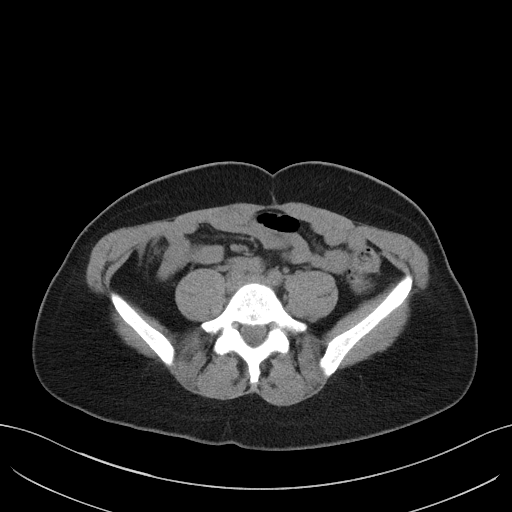
[im 46/88  soft-tissue]
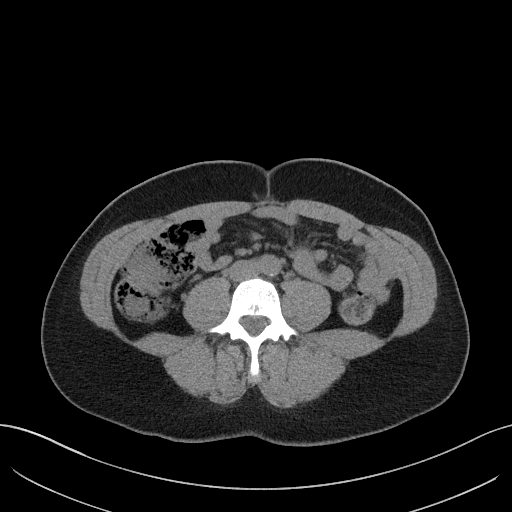
[im 49/88  soft-tissue]
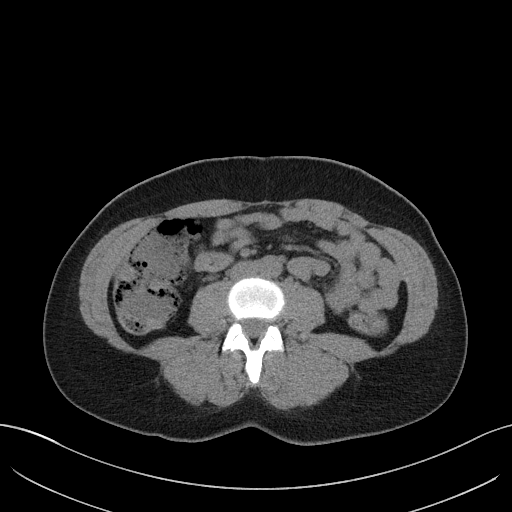
[im 56/88  soft-tissue]
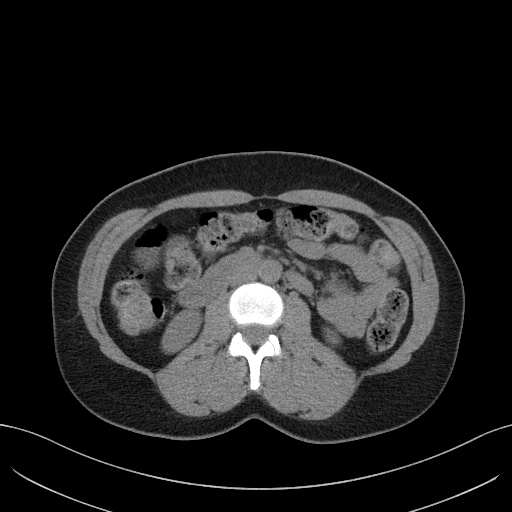
[im 56/88  bone]
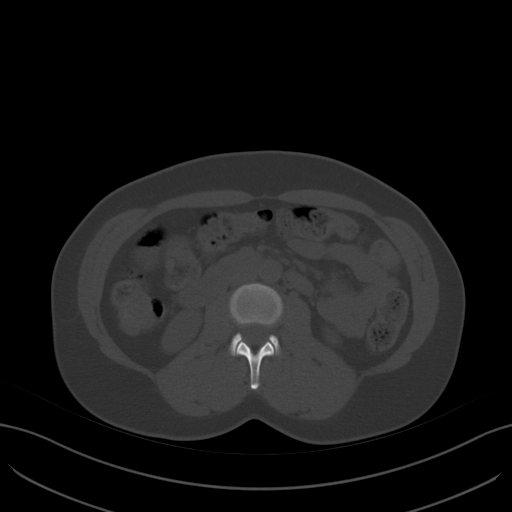
[im 63/88  soft-tissue]
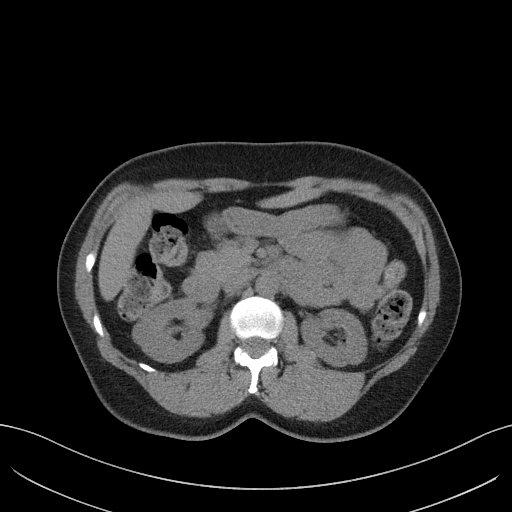
[im 70/88  soft-tissue]
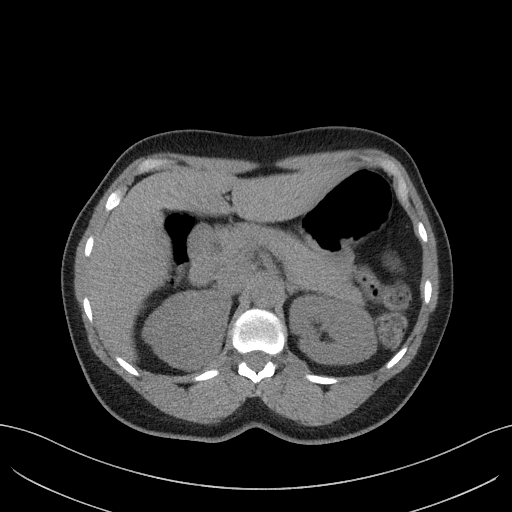
[im 77/88  soft-tissue]
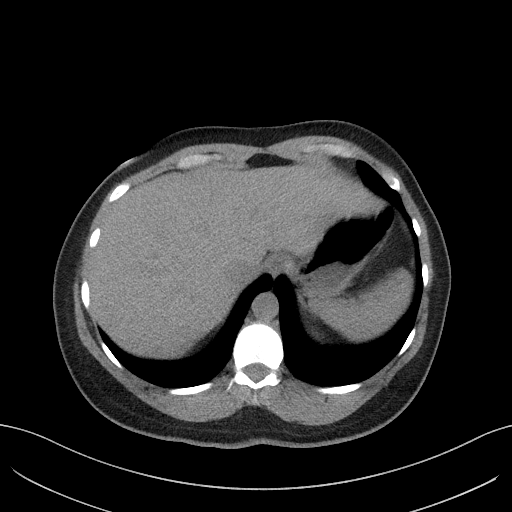
[im 84/88  soft-tissue]
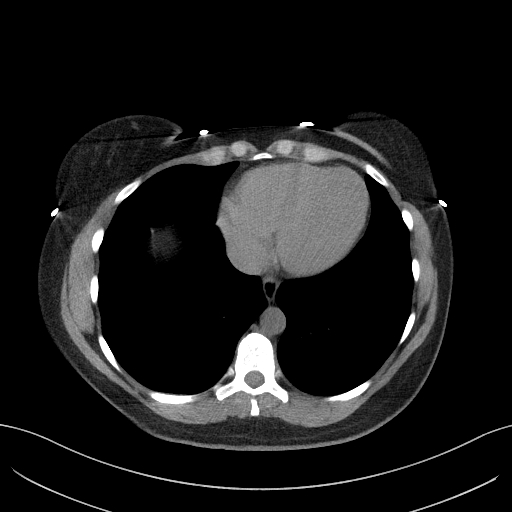

[Series 6: a/p w/o cor · coronal · non-contrast · 0.77mm/px · 3 of 147 slices shown]
[im 49/147  soft-tissue]
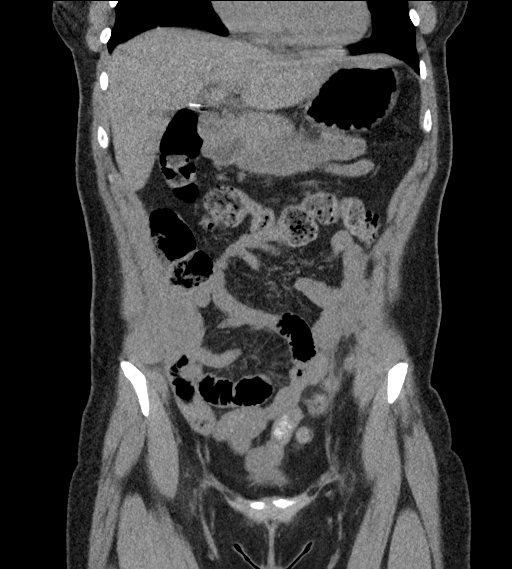
[im 65/147  soft-tissue]
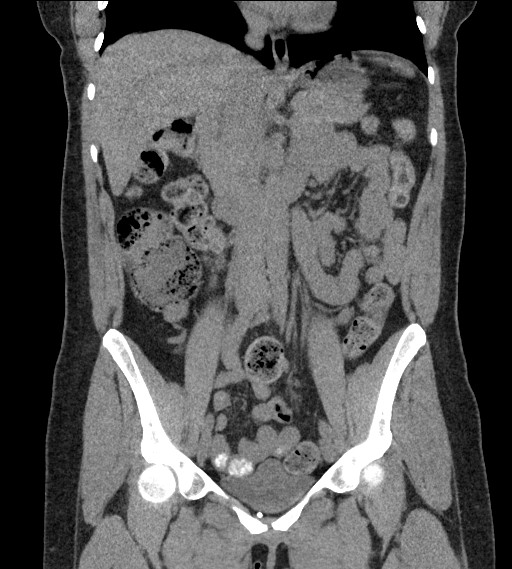
[im 82/147  soft-tissue]
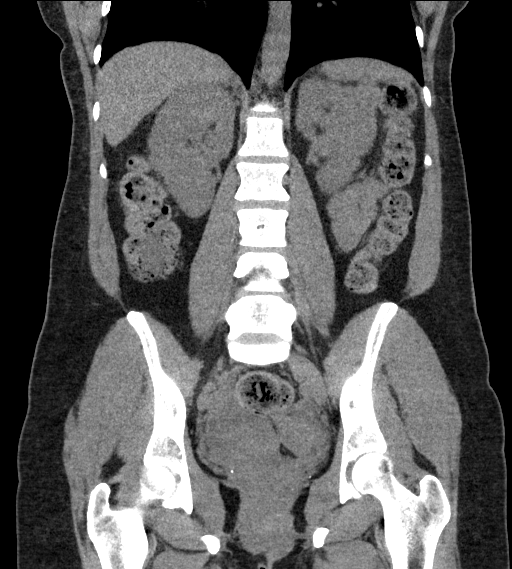

[16 of 46 positions shown; findings below may reference images not displayed]

FINDINGS: Lower chest: The lung bases are clear.

Hepatobiliary: No focal liver abnormality is seen. Status post
cholecystectomy. No biliary dilatation. No ductal dilatation or
inflammation.

Pancreas: No ductal dilatation or inflammation.

Spleen: Normal in size without focal abnormality.

Adrenals/Urinary Tract: No adrenal nodule. No hydronephrosis of
either kidney. No significant perinephric edema. No renal calculi.
Contour deformity of the upper right kidney with rounded homogeneous
isodense lesion measuring 5.3 cm, incompletely characterized without
contrast. No obvious left renal lesion on noncontrast exam. Both
ureters are decompressed without stones along the course. The
urinary bladder is minimally distended. No bladder stone or wall
thickening.

Stomach/Bowel: Bowel evaluation is limited in the absence of enteric
contrast and paucity of intra-abdominal fat. Stomach is minimally
distended, grossly unremarkable. Normal positioning of the duodenum
and ligament of Treitz. There is no small bowel obstruction or
evidence of inflammation. Normal appendix, for example series 6,
image 66. Moderate volume of stool throughout the colon. Mild
sigmoid colonic redundancy. There is no colonic wall thickening or
inflammatory change.

Vascular/Lymphatic: Mild aortic atherosclerosis. No aortic aneurysm.
No bulky abdominopelvic adenopathy.

Reproductive: Status post hysterectomy. The ovaries may remain,
adnexa are not well evaluated in the absence of contrast. Question
of small amount of pelvic free fluid.

Other: No upper abdominal ascites.  No free air.

Musculoskeletal: There are no acute or suspicious osseous
abnormalities.
IMPRESSION: 1. No renal stones or obstructive uropathy.
2. Contour deformity of the upper right kidney with rounded
homogeneous isodense lesion measuring 5.3 cm, incompletely
characterized without contrast. Recommend further evaluation with
renal protocol MRI on an elective basis.
3. Moderate colonic stool burden, can be seen with constipation.
4. Post hysterectomy. Ovaries may remain, however the adnexa are not
well delineated on this noncontrast exam. Recommend correlation with
surgical history. If patient has history of oophorectomy or there
are symptoms referable to the pelvis, consider adnexal evaluation
with ultrasound.

Aortic Atherosclerosis ([id number]-VDO.O).

## 2020-09-16 ENCOUNTER — Emergency Department (HOSPITAL_COMMUNITY)
Admission: EM | Admit: 2020-09-16 | Discharge: 2020-09-16 | Disposition: A | Discharge status: Home / Self Care | Payer: 59 | Attending: Emergency Medicine | Admitting: Emergency Medicine

## 2020-09-16 ENCOUNTER — Encounter (HOSPITAL_COMMUNITY): Payer: Self-pay

## 2020-09-16 ENCOUNTER — Emergency Department (HOSPITAL_COMMUNITY): Payer: 59

## 2020-09-16 ENCOUNTER — Other Ambulatory Visit: Payer: Self-pay

## 2020-09-16 DIAGNOSIS — Z79899 Other long term (current) drug therapy: Secondary | ICD-10-CM | POA: Diagnosis not present

## 2020-09-16 DIAGNOSIS — R1031 Right lower quadrant pain: Secondary | ICD-10-CM

## 2020-09-16 DIAGNOSIS — Z7984 Long term (current) use of oral hypoglycemic drugs: Secondary | ICD-10-CM | POA: Diagnosis not present

## 2020-09-16 DIAGNOSIS — I10 Essential (primary) hypertension: Secondary | ICD-10-CM | POA: Insufficient documentation

## 2020-09-16 DIAGNOSIS — R102 Pelvic and perineal pain: Secondary | ICD-10-CM

## 2020-09-16 LAB — URINALYSIS, ROUTINE W REFLEX MICROSCOPIC
Bilirubin Urine: NEGATIVE
Glucose, UA: NEGATIVE mg/dL
Hgb urine dipstick: NEGATIVE
Ketones, ur: 80 mg/dL — AB
Leukocytes,Ua: NEGATIVE
Nitrite: NEGATIVE
Protein, ur: 30 mg/dL — AB
Specific Gravity, Urine: 1.021 (ref 1.005–1.030)
pH: 8 (ref 5.0–8.0)

## 2020-09-16 LAB — COMPREHENSIVE METABOLIC PANEL
ALT: 22 U/L (ref 0–44)
AST: 29 U/L (ref 15–41)
Albumin: 4.7 g/dL (ref 3.5–5.0)
Alkaline Phosphatase: 48 U/L (ref 38–126)
Anion gap: 14 (ref 5–15)
BUN: 17 mg/dL (ref 6–20)
CO2: 22 mmol/L (ref 22–32)
Calcium: 9.8 mg/dL (ref 8.9–10.3)
Chloride: 102 mmol/L (ref 98–111)
Creatinine, Ser: 0.71 mg/dL (ref 0.44–1.00)
GFR calc Af Amer: >60 mL/min (ref >60)
GFR calc non Af Amer: >60 mL/min (ref >60)
Glucose, Bld: 130 mg/dL — ABNORMAL HIGH (ref 70–99)
Potassium: 3.3 mmol/L — ABNORMAL LOW (ref 3.5–5.1)
Sodium: 138 mmol/L (ref 135–145)
Total Bilirubin: 0.5 mg/dL (ref 0.3–1.2)
Total Protein: 8.2 g/dL — ABNORMAL HIGH (ref 6.5–8.1)

## 2020-09-16 LAB — CBC
HCT: 40.5 % (ref 36.0–46.0)
Hemoglobin: 13.2 g/dL (ref 12.0–15.0)
MCH: 27.6 pg (ref 26.0–34.0)
MCHC: 32.6 g/dL (ref 30.0–36.0)
MCV: 84.6 fL (ref 80.0–100.0)
Platelets: 291 10*3/uL (ref 150–400)
RBC: 4.79 MIL/uL (ref 3.87–5.11)
RDW: 12.8 % (ref 11.5–15.5)
WBC: 8.7 10*3/uL (ref 4.0–10.5)
nRBC: 0 % (ref 0.0–0.2)

## 2020-09-16 LAB — LIPASE, BLOOD: Lipase: 27 U/L (ref 11–51)

## 2020-09-16 MED ORDER — SODIUM CHLORIDE 0.9 % IV BOLUS
1000.0000 mL | Freq: Once | INTRAVENOUS | Status: AC
  Administered 2020-09-16: 1000 mL via INTRAVENOUS

## 2020-09-16 MED ORDER — ONDANSETRON HCL 4 MG PO TABS: 4.0000 mg | ORAL_TABLET | Freq: Four times a day (QID) | ORAL | 0 refills | Status: DC

## 2020-09-16 MED ORDER — MORPHINE SULFATE (PF) 4 MG/ML IV SOLN
4.0000 mg | Freq: Once | INTRAVENOUS | Status: AC
  Administered 2020-09-16: 4 mg via INTRAVENOUS
  Filled 2020-09-16: qty 1

## 2020-09-16 MED ORDER — IOHEXOL 300 MG/ML  SOLN
100.0000 mL | Freq: Once | INTRAMUSCULAR | Status: AC | PRN
  Administered 2020-09-16: 100 mL via INTRAVENOUS

## 2020-09-16 MED ORDER — HYDROCODONE-ACETAMINOPHEN 5-325 MG PO TABS: 1.0000 | ORAL_TABLET | Freq: Four times a day (QID) | ORAL | 0 refills | Status: DC | PRN

## 2020-09-16 MED ORDER — SODIUM CHLORIDE 0.9 % IV BOLUS
500.0000 mL | Freq: Once | INTRAVENOUS | Status: AC
  Administered 2020-09-16: 500 mL via INTRAVENOUS

## 2020-09-16 MED ORDER — ONDANSETRON HCL 4 MG/2ML IJ SOLN
4.0000 mg | Freq: Once | INTRAMUSCULAR | Status: AC
  Administered 2020-09-16: 4 mg via INTRAVENOUS
  Filled 2020-09-16: qty 2

## 2020-09-16 NOTE — Discharge Instructions (Addendum)
Please return for any problem.   Follow up with your regular care provider.   Close follow up with your regular GYN provider is important - today's workup suggests the possibility of masses or cysts near both of your ovaries. This requires close follow up and possible further outpatient workup.

## 2020-09-16 NOTE — ED Provider Notes (Signed)
[facility] DEPT Provider Note   CSN: [id number] Arrival date & time: 09/16/20  9211     History Chief Complaint  Patient presents with  . Abdominal Pain    [name] is a 49 y.o. female.  49 year old female with prior medical history as detailed below presents for evaluation of right-sided flank and right lower quadrant abdominal pain.  Patient reports prior episodes of similar pain like this previously.  Her last episode was on the left flank.  She was seen in the ED at that time.  Work-up was without evidence of significant findings.  Today's symptoms started early this morning.  She reports pain associated with nausea and vomiting.  She denies fever.  She denies bowel movement change.  She denies vaginal discharge or bleeding.  She denies urinary symptoms.  She attempted to take pain medicine at home but was unable to keep the medicine down secondary to vomiting.  Of note, patient with history of PCOS.  She is status post cholecystectomy and hysterectomy.  The history is provided by the patient and medical records.  Abdominal Pain Pain location:  RLQ Pain quality: aching and cramping   Pain radiates to:  Does not radiate Pain severity:  Mild Onset quality:  Gradual Duration:  12 days Timing:  Constant Chronicity:  Recurrent Relieved by:  Nothing Worsened by:  Nothing      Past Medical History:  Diagnosis Date  . Allergy    takes Claritin and uses Flonase daily  . History of bronchitis as a child   . Hyperlipidemia    not on any meds  . Hypertension    takes HCTZ daily  . PCOS (polycystic ovarian syndrome)     Patient Active Problem List   Diagnosis Date Noted  . Uterine fibroid 08/28/2019  . Status post total hysterectomy 08/28/2019    Past Surgical History:  Procedure Laterality Date  . CERVICAL CERCLAGE    . CHOLECYSTECTOMY N/A 04/13/2017   Procedure: LAPAROSCOPIC CHOLECYSTECTOMY;  Surgeon: Clovis Riley, MD;   Location: [facility];  Service: General;  Laterality: N/A;  . COLONOSCOPY    . cyst removed from ovary    . DILATION AND CURETTAGE OF UTERUS    . NASAL SINUS SURGERY    . REFRACTIVE SURGERY    . ROBOTIC ASSISTED LAPAROSCOPIC HYSTERECTOMY AND SALPINGECTOMY Bilateral 08/28/2019   Procedure: XI ROBOTIC ASSISTED LAPAROSCOPIC HYSTERECTOMY AND SALPINGECTOMY;  Surgeon: Servando Salina, MD;  Location: [facility];  Service: Gynecology;  Laterality: Bilateral;  Gaylord Shih RNFA  . UTERINE ARTERY EMBOLIZATION    . wisdom teeth extracted       OB History   No obstetric history on file.     Family History  Problem Relation Age of Onset  . Diabetes Father   . Hypertension Father   . Cancer Father   . Heart disease Father   . Cancer Other   . Sleep apnea Other     Social History   Tobacco Use  . Smoking status: Never Smoker  . Smokeless tobacco: Never Used  Vaping Use  . Vaping Use: Never used  Substance Use Topics  . Alcohol use: Yes    Comment: social  . Drug use: No    Home Medications Prior to Admission medications   Medication Sig Start Date End Date Taking? Authorizing Provider  Ascorbic Acid (VITAMIN C) 500 MG CAPS Take 1 tablet by mouth daily.   Yes [provider]  B Complex-C (  SUPER B COMPLEX PO) Take 2 each by mouth daily. Chewables   Yes [provider]  ezetimibe (ZETIA) 10 MG tablet Take 10 mg by mouth daily.  04/26/20  Yes [provider]  fluticasone (FLONASE) 50 MCG/ACT nasal spray Place 1-2 sprays into both nostrils daily as needed for allergies or rhinitis.   Yes [provider]  hydrochlorothiazide (HYDRODIURIL) 12.5 MG tablet Take 12.5 mg by mouth every evening.    Yes [provider]  hyoscyamine (LEVBID) 0.375 MG 12 hr tablet Take 0.375 mg by mouth at bedtime. 08/05/20  Yes [provider]  hyoscyamine (LEVSIN SL) 0.125 MG SL tablet Take 0.125 mg by mouth every 4 (four) hours as needed for  cramping. 08/31/20  Yes [provider]  loratadine (CLARITIN) 10 MG tablet Take 10 mg by mouth daily.   Yes [provider]  metFORMIN (GLUCOPHAGE-XR) 500 MG 24 hr tablet Take 1,000 mg by mouth at bedtime.    Yes [provider]  Multiple Vitamins-Minerals (ADULT GUMMY) CHEW Chew 1 tablet by mouth daily.   Yes [provider]  oxyCODONE-acetaminophen (PERCOCET) 5-325 MG tablet Take 1-2 tablets by mouth every 6 (six) hours as needed. 07/21/20  Yes Delo, Nathaneil Canary, MD  HYDROcodone-acetaminophen (NORCO/VICODIN) 5-325 MG tablet Take 1 tablet by mouth every 6 (six) hours as needed. 09/16/20   Valarie Merino, MD  ibuprofen (ADVIL) 800 MG tablet Take 1 tablet (800 mg total) by mouth every 6 (six) hours. Patient not taking: Reported on 09/16/2020 08/29/19   Servando Salina, MD  naproxen (NAPROSYN) 500 MG tablet Take 1 tablet (500 mg total) by mouth 2 (two) times daily with a meal. Patient not taking: Reported on 09/16/2020 07/21/20   Veryl Speak, MD  ondansetron (ZOFRAN) 4 MG tablet Take 1 tablet (4 mg total) by mouth every 6 (six) hours. 09/16/20   Valarie Merino, MD    Allergies    Patient has no known allergies.  Review of Systems   Review of Systems  Gastrointestinal: Positive for abdominal pain.  All other systems reviewed and are negative.   Physical Exam Updated Vital Signs BP (!) 151/75   Pulse 78   Temp 98.7 F (37.1 C) (Oral)   Resp 17   LMP 08/22/2019 (Exact Date) Comment: camila  SpO2 98%   Physical Exam Vitals and nursing note reviewed.  Constitutional:      General: She is not in acute distress.    Appearance: She is well-developed.  HENT:     Head: Normocephalic and atraumatic.  Eyes:     Conjunctiva/sclera: Conjunctivae normal.     Pupils: Pupils are equal, round, and reactive to light.  Cardiovascular:     Rate and Rhythm: Normal rate and regular rhythm.     Heart sounds: Normal heart sounds.  Pulmonary:     Effort: Pulmonary  effort is normal. No respiratory distress.     Breath sounds: Normal breath sounds.  Abdominal:     General: There is no distension.     Palpations: Abdomen is soft.     Tenderness: There is abdominal tenderness in the right lower quadrant.     Comments: Mild RLQ tenderness   Genitourinary:    Comments: Pelvic declined  Musculoskeletal:        General: No deformity. Normal range of motion.     Cervical back: Normal range of motion and neck supple.  Skin:    General: Skin is warm and dry.  Neurological:  Mental Status: She is alert and oriented to person, place, and time.     ED Results / Procedures / Treatments   Labs (all labs ordered are listed, but only abnormal results are displayed) Labs Reviewed  COMPREHENSIVE METABOLIC PANEL - Abnormal; Notable for the following components:      Result Value   Potassium 3.3 (*)    Glucose, Bld 130 (*)    Total Protein 8.2 (*)    All other components within normal limits  URINALYSIS, ROUTINE W REFLEX MICROSCOPIC - Abnormal; Notable for the following components:   Ketones, ur 80 (*)    Protein, ur 30 (*)    Bacteria, UA RARE (*)    All other components within normal limits  LIPASE, BLOOD  CBC    EKG None  Radiology CT Abdomen Pelvis W Contrast  Result Date: 09/16/2020 CLINICAL DATA:  Right lower quadrant pain EXAM: CT ABDOMEN AND PELVIS WITH CONTRAST TECHNIQUE: Multidetector CT imaging of the abdomen and pelvis was performed using the standard protocol following bolus administration of intravenous contrast. CONTRAST:  171mL OMNIPAQUE IOHEXOL 300 MG/ML  SOLN COMPARISON:  07/21/2020 FINDINGS: Lower chest: Lung bases are clear. No effusions. Heart is normal size. Hepatobiliary: Low-density lesion inferiorly in the right hepatic lobe not definitively seen on prior noncontrast study. This measures 1.2 cm and partially fills in on delayed imaging. Favor hemangioma. Prior cholecystectomy. Pancreas: No focal abnormality or ductal  dilatation. Spleen: No focal abnormality.  Normal size. Adrenals/Urinary Tract: Mixed density mass in the upper pole of the right kidney measures 5.2 cm. No hydronephrosis. Adrenal glands and left kidney unremarkable. Urinary bladder unremarkable. Stomach/Bowel: Appendix is not definitively seen. Stomach, large and small bowel grossly unremarkable. Vascular/Lymphatic: No evidence of aneurysm or adenopathy. Scattered aortic calcifications. Reproductive: Prior hysterectomy. Soft tissue density seen in the right side of the pelvis measuring 4.5 cm. Other: Moderate free fluid in the pelvis. Small amount of free fluid adjacent to the cecum in the right lower quadrant. Musculoskeletal: No acute bony abnormality. IMPRESSION: Complex 5.2 cm lesion in the upper pole of the right kidney. This could reflect a complex cyst or solid renal lesion. Recommend further evaluation with MRI. Small low-density lesion in the inferior posterior liver, favor hemangioma. This could also be evaluated on MRI. 4.5 cm soft tissue density in the right side of the pelvis with surrounding moderate fluid. This is difficult to characterize but may reflect the right ovary. This could be further evaluated with pelvic ultrasound. Appendix not definitively seen. Electronically Signed   By: Rolm Baptise M.D.   On: 09/16/2020 11:21   US PELVIC COMPLETE W TRANSVAGINAL AND TORSION R/O  Result Date: 09/16/2020 CLINICAL DATA:  Pelvic pain.  Abnormal CT. EXAM: TRANSABDOMINAL AND TRANSVAGINAL ULTRASOUND OF PELVIS TECHNIQUE: Both transabdominal and transvaginal ultrasound examinations of the pelvis were performed. Transabdominal technique was performed for global imaging of the pelvis including uterus, ovaries, adnexal regions, and pelvic cul-de-sac. It was necessary to proceed with endovaginal exam following the transabdominal exam to visualize the adnexa. COMPARISON:  None FINDINGS: Uterus Hysterectomy.  Endometrium Right ovary Measurements: 7.2 x 4.3 x  6.9 cm. Bilateral adnexal masses are noted. There is appear to be solid. Maximum diameter appears to be 4.4 cm. Left ovary Measurements: 3.6 x 3.4 x 4.4 cm. Again bilateral adnexal masses are noted. These appear solid. Maximum diameter appears to be 4.4 cm. Other findings No abnormal free fluid. IMPRESSION: Bilateral solid appearing adnexal masses measuring up to 4.4 cm. Although an ovarian  origin cannot be excluded these could be extra ovarian. Pelvic MRI can be obtained to further evaluate as clinically indicated. Electronically Signed   By: Marcello Moores  Register   On: 09/16/2020 12:52    Procedures Procedures (including critical care time)  Medications Ordered in ED Medications  sodium chloride 0.9 % bolus 1,000 mL (1,000 mLs Intravenous New Bag/Given 09/16/20 1032)  ondansetron (ZOFRAN) injection 4 mg (4 mg Intravenous Given 09/16/20 1033)  morphine 4 MG/ML injection 4 mg (4 mg Intravenous Given 09/16/20 1032)  sodium chloride 0.9 % bolus 500 mL (500 mLs Intravenous New Bag/Given (Non-Interop) 09/16/20 1115)  iohexol (OMNIPAQUE) 300 MG/ML solution 100 mL (100 mLs Intravenous Contrast Given 09/16/20 1059)  morphine 4 MG/ML injection 4 mg (4 mg Intravenous Given 09/16/20 1154)    ED Course  I have reviewed the triage vital signs and the nursing notes.  Pertinent labs & imaging results that were available during my care of the patient were reviewed by me and considered in my medical decision making (see chart for details).    MDM Rules/Calculators/A&P                          MDM  Screen complete  MATEYA TORTI was evaluated in Emergency Department on 09/16/2020 for the symptoms described in the history of present illness. She was evaluated in the context of the global COVID-19 pandemic, which necessitated consideration that the patient might be at risk for infection with the SARS-CoV-2 virus that causes COVID-19. Institutional protocols and algorithms that pertain to the evaluation of patients at  risk for COVID-19 are in a state of rapid change based on information released by regulatory bodies including the CDC and federal and state organizations. These policies and algorithms were followed during the patient's care in the ED.   Patient is presenting for evaluation of reported abdominal discomfort.  Labs obtained are without significant findings.   CT imaging demonstrated possible adnexal mass.   US demonstrates possible bilateral adnexal mass.   Patient with current GYN provider.  Patient's findings today are perhaps more likely related to her prior diagnosis of PCOS.  Patient feels significantly improved following her ED evaluation and treatment.  She now desires discharge home.  She does understand need for close follow-up.  Strict return precautions given and understood.  Patient is aware that she needs close follow-up with GYN and possible additional outpatient work-up including repeat ultrasound and/or ovarian MRI.   Final Clinical Impression(s) / ED Diagnoses Final diagnoses:  Right lower quadrant abdominal pain    Rx / DC Orders ED Discharge Orders         Ordered    HYDROcodone-acetaminophen (NORCO/VICODIN) 5-325 MG tablet  Every 6 hours PRN        09/16/20 1333    ondansetron (ZOFRAN) 4 MG tablet  Every 6 hours        09/16/20 1333           Valarie Merino, MD 09/16/20 1339

## 2020-09-16 NOTE — ED Triage Notes (Signed)
Pt presents with c/o right lower quadrant abdominal pain radiating to her right flank area. Pt reports this has happened before on the opposite side but there was never a definitive diagnosis. She did go see her GI doctor and was told they believe she has IBS but pt does not believe this is IBS as she is also vomiting.

## 2020-09-16 NOTE — ED Notes (Signed)
Save blue in main lab 

## 2020-09-22 ENCOUNTER — Other Ambulatory Visit (HOSPITAL_BASED_OUTPATIENT_CLINIC_OR_DEPARTMENT_OTHER): Payer: Self-pay | Admitting: Family Medicine

## 2020-09-22 ENCOUNTER — Other Ambulatory Visit (HOSPITAL_BASED_OUTPATIENT_CLINIC_OR_DEPARTMENT_OTHER): Payer: Self-pay | Admitting: Obstetrics and Gynecology

## 2020-09-22 DIAGNOSIS — R1900 Intra-abdominal and pelvic swelling, mass and lump, unspecified site: Secondary | ICD-10-CM

## 2020-09-22 DIAGNOSIS — R109 Unspecified abdominal pain: Secondary | ICD-10-CM

## 2020-09-22 DIAGNOSIS — N2889 Other specified disorders of kidney and ureter: Secondary | ICD-10-CM

## 2020-09-25 ENCOUNTER — Ambulatory Visit (HOSPITAL_BASED_OUTPATIENT_CLINIC_OR_DEPARTMENT_OTHER): Payer: 59

## 2020-10-02 ENCOUNTER — Other Ambulatory Visit: Payer: Self-pay

## 2020-10-02 ENCOUNTER — Encounter (HOSPITAL_BASED_OUTPATIENT_CLINIC_OR_DEPARTMENT_OTHER): Payer: Self-pay

## 2020-10-02 ENCOUNTER — Ambulatory Visit (HOSPITAL_BASED_OUTPATIENT_CLINIC_OR_DEPARTMENT_OTHER)
Admission: RE | Admit: 2020-10-02 | Discharge: 2020-10-02 | Disposition: A | Discharge status: Home / Self Care | Payer: 59 | Source: Ambulatory Visit | Attending: Family Medicine | Admitting: Family Medicine

## 2020-10-02 DIAGNOSIS — R1900 Intra-abdominal and pelvic swelling, mass and lump, unspecified site: Secondary | ICD-10-CM | POA: Diagnosis present

## 2020-10-02 DIAGNOSIS — N2889 Other specified disorders of kidney and ureter: Secondary | ICD-10-CM | POA: Diagnosis not present

## 2020-10-02 DIAGNOSIS — R109 Unspecified abdominal pain: Secondary | ICD-10-CM | POA: Diagnosis present

## 2020-10-02 MED ORDER — GADOBUTROL 1 MMOL/ML IV SOLN
7.0000 mL | Freq: Once | INTRAVENOUS | Status: AC | PRN
  Administered 2020-10-02: 7 mL via INTRAVENOUS

## 2020-10-15 ENCOUNTER — Ambulatory Visit: Payer: 59 | Attending: Internal Medicine

## 2020-10-15 DIAGNOSIS — Z23 Encounter for immunization: Secondary | ICD-10-CM

## 2020-10-15 NOTE — Progress Notes (Signed)
   Covid-19 Vaccination Clinic  Name:  [name]    MRN: [id number] DOB: 07/01/1971  10/15/2020  Ms. [name] was observed post Covid-19 immunization for 15 minutes without incident. She was provided with Vaccine Information Sheet and instruction to access the V-Safe system.   Ms. [name] was instructed to call 911 with any severe reactions post vaccine: Marland Kitchen Difficulty breathing  . Swelling of face and throat  . A fast heartbeat  . A bad rash all over body  . Dizziness and weakness

## 2020-10-19 ENCOUNTER — Encounter (HOSPITAL_COMMUNITY): Payer: Self-pay

## 2020-10-19 ENCOUNTER — Other Ambulatory Visit (HOSPITAL_COMMUNITY): Payer: Self-pay | Admitting: Urology

## 2020-10-19 DIAGNOSIS — D49511 Neoplasm of unspecified behavior of right kidney: Secondary | ICD-10-CM

## 2020-10-19 NOTE — Progress Notes (Signed)
Lennox Solders Female, 49 y.o., [birth year] MRN:  [id number] Phone:  [phone] Jerilynn Mages) PCP:  Kelton Pillar, MD Coverage:  [place] [facility]/[employer] Other Next Appt With Radiology ([facility] 2) 10/27/2020 at 8:00 AM  RE: Biopsy Received: Today Message Details  Markus Daft, MD  Leanna Battles [facility] Procedure Requests  Discussed with Urologist. Patient wants biopsy prior to resection. Would schedule for US guided biopsy of right renal mass.   [facility]   Previous Messages  ----- Message -----  From: Lenore Cordia  Sent: 10/19/2020  2:44 PM EDT  To: [facility] Procedure Requests  Subject: Biopsy                      Procedure Requested: Right Renal Mass Biopsy   Reason for Procedure: Right renal neoplasm    Provider Requesting: Rexene Alberts  Provider Telephone: [phone]    Other Info:

## 2020-10-26 ENCOUNTER — Other Ambulatory Visit: Payer: Self-pay | Admitting: Radiology

## 2020-10-27 ENCOUNTER — Other Ambulatory Visit: Payer: Self-pay

## 2020-10-27 ENCOUNTER — Ambulatory Visit (HOSPITAL_COMMUNITY)
Admission: RE | Admit: 2020-10-27 | Discharge: 2020-10-27 | Disposition: A | Discharge status: Home / Self Care | Payer: 59 | Source: Ambulatory Visit | Attending: Urology | Admitting: Urology

## 2020-10-27 DIAGNOSIS — D49511 Neoplasm of unspecified behavior of right kidney: Secondary | ICD-10-CM | POA: Diagnosis present

## 2020-10-27 DIAGNOSIS — C649 Malignant neoplasm of unspecified kidney, except renal pelvis: Secondary | ICD-10-CM | POA: Insufficient documentation

## 2020-10-27 LAB — CBC
HCT: 38.3 % (ref 36.0–46.0)
Hemoglobin: 11.9 g/dL — ABNORMAL LOW (ref 12.0–15.0)
MCH: 27 pg (ref 26.0–34.0)
MCHC: 31.1 g/dL (ref 30.0–36.0)
MCV: 87 fL (ref 80.0–100.0)
Platelets: 289 10*3/uL (ref 150–400)
RBC: 4.4 MIL/uL (ref 3.87–5.11)
RDW: 13.1 % (ref 11.5–15.5)
WBC: 5.1 10*3/uL (ref 4.0–10.5)
nRBC: 0 % (ref 0.0–0.2)

## 2020-10-27 LAB — PROTIME-INR
INR: 1 (ref 0.8–1.2)
Prothrombin Time: 12.7 seconds (ref 11.4–15.2)

## 2020-10-27 MED ORDER — SODIUM CHLORIDE 0.9 % IV SOLN: INTRAVENOUS | Status: DC

## 2020-10-27 MED ORDER — MIDAZOLAM HCL 2 MG/2ML IJ SOLN
INTRAMUSCULAR | Status: AC | PRN
  Administered 2020-10-27: 1 mg via INTRAVENOUS

## 2020-10-27 MED ORDER — MIDAZOLAM HCL 2 MG/2ML IJ SOLN
INTRAMUSCULAR | Status: AC
  Filled 2020-10-27: qty 2

## 2020-10-27 MED ORDER — GELATIN ABSORBABLE 12-7 MM EX MISC
CUTANEOUS | Status: AC
  Filled 2020-10-27: qty 1

## 2020-10-27 MED ORDER — FENTANYL CITRATE (PF) 100 MCG/2ML IJ SOLN
INTRAMUSCULAR | Status: AC | PRN
Start: 2020-10-27 — End: 2020-10-27
  Administered 2020-10-27: 50 ug via INTRAVENOUS

## 2020-10-27 MED ORDER — LIDOCAINE HCL (PF) 1 % IJ SOLN
INTRAMUSCULAR | Status: AC
  Filled 2020-10-27: qty 30

## 2020-10-27 MED ORDER — FENTANYL CITRATE (PF) 100 MCG/2ML IJ SOLN
INTRAMUSCULAR | Status: AC
  Filled 2020-10-27: qty 2

## 2020-10-27 MED ORDER — HYDROCODONE-ACETAMINOPHEN 5-325 MG PO TABS: 1.0000 | ORAL_TABLET | ORAL | Status: DC | PRN

## 2020-10-27 NOTE — Sedation Documentation (Signed)
Patient transported to short stay. Site assessed with berkley RN. No changes from previous assessment.

## 2020-10-27 NOTE — Discharge Instructions (Signed)
Percutaneous Kidney Biopsy, Care After This sheet gives you information about how to care for yourself after your procedure. Your health care provider may also give you more specific instructions. If you have problems or questions, contact your health care provider. What can I expect after the procedure? After the procedure, it is common to have:  Pain or soreness near the biopsy site.  Pink or cloudy urine for 24 hours after the procedure. Follow these instructions at home: Activity  Return to your normal activities as told by your health care provider. Ask your health care provider what activities are safe for you.  If you were given a sedative during the procedure, it can affect you for several hours. Do not drive or operate machinery until your health care provider says that it is safe.  Do not lift anything that is heavier than 10 lb (4.5 kg), or the limit that you are told, until your health care provider says that it is safe.  Avoid activities that take a lot of effort (are strenuous) until your health care provider approves. Most people will have to wait 2 weeks before returning to activities such as exercise or sex. General instructions   Take over-the-counter and prescription medicines only as told by your health care provider.  You may eat and drink after your procedure. Follow instructions from your health care provider about eating or drinking restrictions.  Check your biopsy site every day for signs of infection. Check for: ? More redness, swelling, or pain. ? Fluid or blood. ? Warmth. ? Pus or a bad smell.  Keep all follow-up visits as told by your health care provider. This is important. Contact a health care provider if:  You have more redness, swelling, or pain around your biopsy site.  You have fluid or blood coming from your biopsy site.  Your biopsy site feels warm to the touch.  You have pus or a bad smell coming from your biopsy site.  You have blood  in your urine more than 24 hours after your procedure.  You have a fever. Get help right away if:  Your urine is dark red or brown.  You cannot urinate.  It burns when you urinate.  You feel dizzy or light-headed.  You have severe pain in your abdomen or side. Summary  After the procedure, it is common to have pain or soreness at the biopsy site and pink or cloudy urine for the first 24 hours.  Check your biopsy site each day for signs of infection, such as more redness, swelling, or pain; fluid, blood, pus or a bad smell coming from the biopsy site; or the biopsy site feeling warm to touch.  Return to your normal activities as told by your health care provider. This information is not intended to replace advice given to you by your health care provider. Make sure you discuss any questions you have with your health care provider. Document Revised: 08/14/2019 Document Reviewed: 08/14/2019 Elsevier Patient Education  2020 [employer]  

## 2020-10-27 NOTE — Procedures (Signed)
Interventional Radiology Procedure Note  Procedure: US guided core biopsy of right upper pole renal mass.   Complications: None  Estimated Blood Loss: None  Recommendations: - Path sent - Bedrest x 4 hrs - DC home  Signed,  Criselda Peaches, MD

## 2020-10-27 NOTE — Progress Notes (Signed)
Discharge instructions reviewed with patient and family. Patient ambulated to restroom and had no difficulty urinating- no blood noted in urine.

## 2020-10-27 NOTE — Sedation Documentation (Addendum)
Biopsy site assessed. Pink warm and dry. No drainage noted from bandage. Soft to palpation.

## 2020-10-27 NOTE — H&P (Addendum)
Referring Physician(s): Gay,Matthew R  Supervising Physician: Jacqulynn Cadet  Patient Status:  [facility] OP  Chief Complaint:  "I'm having a kidney biopsy"  Subjective: Pt familiar to IR service from [facility] in 2008. She has had hx of intermittent RLQ/flank pain and recent imaging revealing:  5.2 cm solid mass in upper pole of right kidney, highly suspicious for renal cell carcinoma. No evidence of metastatic disease.  1.4 cm benign hemangioma in posterior right hepatic lobe, corresponding with lesion seen on recent CT.  3.7 cm hemorrhagic right ovarian cyst.  Mildly enlarged left ovary with diffuse T1 hyperintensity suggesting hemorrhage, and lack of definite contrast enhancement. This is similar in appearance to previous ultrasound, however intermittent ovarian torsion cannot be excluded. Consider pelvic ultrasound with Doppler for further evaluation.   She has no hx cancer and presents today for image guided right renal mass bx for further evaluation. She denies fever,HA,CP,dyspnea, cough, N/V or bleeding.    Past Medical History:  Diagnosis Date  . Allergy    takes Claritin and uses Flonase daily  . History of bronchitis as a child   . Hyperlipidemia    not on any meds  . Hypertension    takes HCTZ daily  . PCOS (polycystic ovarian syndrome)    Past Surgical History:  Procedure Laterality Date  . CERVICAL CERCLAGE    . CHOLECYSTECTOMY N/A 04/13/2017   Procedure: LAPAROSCOPIC CHOLECYSTECTOMY;  Surgeon: Clovis Riley, MD;  Location: [place];  Service: General;  Laterality: N/A;  . COLONOSCOPY    . cyst removed from ovary    . DILATION AND CURETTAGE OF UTERUS    . NASAL SINUS SURGERY    . REFRACTIVE SURGERY    . ROBOTIC ASSISTED LAPAROSCOPIC HYSTERECTOMY AND SALPINGECTOMY Bilateral 08/28/2019   Procedure: XI ROBOTIC ASSISTED LAPAROSCOPIC HYSTERECTOMY AND SALPINGECTOMY;  Surgeon: Servando Salina, MD;  Location: [place];  Service: Gynecology;   Laterality: Bilateral;  Gaylord Shih RNFA  . UTERINE ARTERY EMBOLIZATION    . wisdom teeth extracted      Allergies: Patient has no known allergies.  Medications: Prior to Admission medications   Medication Sig Start Date End Date Taking? Authorizing Provider  Ascorbic Acid (VITAMIN C) 500 MG CAPS Take 1 tablet by mouth daily.   Yes [provider]  B Complex-C (SUPER B COMPLEX PO) Take 2 each by mouth daily. Chewables   Yes [provider]  ezetimibe (ZETIA) 10 MG tablet Take 10 mg by mouth daily.  04/26/20  Yes [provider]  hydrochlorothiazide (HYDRODIURIL) 12.5 MG tablet Take 12.5 mg by mouth every evening.    Yes [provider]  hyoscyamine (LEVBID) 0.375 MG 12 hr tablet Take 0.375 mg by mouth at bedtime. 08/05/20  Yes [provider]  fluticasone (FLONASE) 50 MCG/ACT nasal spray Place 1-2 sprays into both nostrils daily as needed for allergies or rhinitis.    [provider]  HYDROcodone-acetaminophen (NORCO/VICODIN) 5-325 MG tablet Take 1 tablet by mouth every 6 (six) hours as needed. 09/16/20   Valarie Merino, MD  hyoscyamine (LEVSIN SL) 0.125 MG SL tablet Take 0.125 mg by mouth every 4 (four) hours as needed for cramping. 08/31/20   [provider]  ibuprofen (ADVIL) 800 MG tablet Take 1 tablet (800 mg total) by mouth every 6 (six) hours. Patient not taking: Reported on 09/16/2020 08/29/19   Servando Salina, MD  loratadine (CLARITIN) 10 MG tablet Take 10 mg by mouth daily.    [provider]  metFORMIN (GLUCOPHAGE-XR) 500 MG 24 hr tablet Take 500 mg by mouth at bedtime.     [provider]  Multiple Vitamins-Minerals (ADULT GUMMY) CHEW Chew 1 tablet by mouth daily.    [provider]  naproxen (NAPROSYN) 500 MG tablet Take 1 tablet (500 mg total) by mouth 2 (two) times daily with a meal. Patient not taking: Reported on 09/16/2020 07/21/20   Veryl Speak, MD  ondansetron (ZOFRAN) 4 MG tablet  Take 1 tablet (4 mg total) by mouth every 6 (six) hours. 09/16/20   Valarie Merino, MD  oxyCODONE-acetaminophen (PERCOCET) 5-325 MG tablet Take 1-2 tablets by mouth every 6 (six) hours as needed. 07/21/20   Veryl Speak, MD     Vital Signs: BP 126/75   Pulse (!) 56   Temp 98 F (36.7 C) (Oral)   Resp 18   Ht 5\' 4"  (1.626 m)   Wt 153 lb (69.4 kg)   LMP 08/22/2019 (Exact Date) Comment: camila  SpO2 100%   BMI 26.26 kg/m   Physical Exam awake/alert; chest- CTA bilat; heart- sl bradycardic but reg rhythm; abd- soft,+BS,NT; no LE edema  Imaging: No results found.  Labs:  CBC: Recent Labs    07/21/20 1410 09/16/20 0818 10/27/20 0629  WBC 8.4 8.7 5.1  HGB 13.7 13.2 11.9*  HCT 42.4 40.5 38.3  PLT 299 291 289    COAGS: No results for input(s): INR, APTT in the last 8760 hours.  BMP: Recent Labs    07/21/20 1410 09/16/20 0818  NA 133* 138  K 3.4* 3.3*  CL 99 102  CO2 19* 22  GLUCOSE 124* 130*  BUN 18 17  CALCIUM 10.1 9.8  CREATININE 0.74 0.71  GFRNONAA >60 >60  GFRAA >60 >60    LIVER FUNCTION TESTS: Recent Labs    07/21/20 1410 09/16/20 0818  BILITOT 0.7 0.5  AST 23 29  ALT 23 22  ALKPHOS 51 48  PROT 7.7 8.2*  ALBUMIN 4.4 4.7    Assessment and Plan: Pt familiar to IR service from [facility] in 2008. PMH sig for HTN,HLD, PCOS. She has had hx of intermittent RLQ/flank pain and recent imaging revealing:  5.2 cm solid mass in upper pole of right kidney, highly suspicious for renal cell carcinoma. No evidence of metastatic disease.  1.4 cm benign hemangioma in posterior right hepatic lobe, corresponding with lesion seen on recent CT.  3.7 cm hemorrhagic right ovarian cyst.  Mildly enlarged left ovary with diffuse T1 hyperintensity suggesting hemorrhage, and lack of definite contrast enhancement. This is similar in appearance to previous ultrasound, however intermittent ovarian torsion cannot be excluded. Consider pelvic ultrasound with Doppler for  further evaluation.   She has no hx cancer and presents today for image guided right renal mass bx for further evaluation.   Electronically Signed: D. Rowe Robert, PA-C 10/27/2020, 7:44 AM   I spent a total of 25 minutes at the the patient's bedside AND on the patient's hospital floor or unit, greater than 50% of which was counseling/coordinating care for image guided right renal mass biopsy

## 2020-10-28 LAB — SURGICAL PATHOLOGY

## 2020-10-29 ENCOUNTER — Other Ambulatory Visit: Payer: Self-pay | Admitting: Urology

## 2020-11-05 NOTE — Patient Instructions (Addendum)
DUE TO COVID-19 ONLY ONE VISITOR IS ALLOWED TO COME WITH YOU AND STAY IN THE WAITING ROOM ONLY DURING PRE OP AND PROCEDURE DAY OF SURGERY. THE 1 VISITOR  MAY VISIT WITH YOU AFTER SURGERY IN YOUR PRIVATE ROOM DURING VISITING HOURS ONLY!  YOU NEED TO HAVE A COVID 19 TEST ON: 11/11/20 @  3:00 PM , THIS TEST MUST BE DONE BEFORE SURGERY,  COVID TESTING SITE [place] [id number], IT IS ON THE RIGHT GOING OUT [address] APPROXIMATELY  2 MINUTES PAST [employer] ON THE RIGHT. ONCE YOUR COVID TEST IS COMPLETED,  PLEASE BEGIN THE QUARANTINE INSTRUCTIONS AS OUTLINED IN YOUR HANDOUT.                [name]    Your procedure is scheduled on: 11/15/20    Report to [facility] Main  Entrance   Report to short stay at: 5:30 AM     Call this number if you have problems the morning of surgery [phone]    Remember: Do not eat food or drink liquids :After Midnight.   BRUSH YOUR TEETH MORNING OF SURGERY AND RINSE YOUR MOUTH OUT, NO CHEWING GUM CANDY OR MINTS.     Take these medicines the morning of surgery with A SIP OF WATER: loratadine.  How to Manage Your Diabetes Before and After Surgery  Why is it important to control my blood sugar before and after surgery? . Improving blood sugar levels before and after surgery helps healing and can limit problems. . A way of improving blood sugar control is eating a healthy diet by: o  Eating less sugar and carbohydrates o  Increasing activity/exercise o  Talking with your doctor about reaching your blood sugar goals . High blood sugars (greater than 180 mg/dL) can raise your risk of infections and slow your recovery, so you will need to focus on controlling your diabetes during the weeks before surgery. . Make sure that the doctor who takes care of your diabetes knows about your planned surgery including the date and location.  How do I manage my blood sugar before surgery? . Check your blood sugar at  least 4 times a day, starting 2 days before surgery, to make sure that the level is not too high or low. o Check your blood sugar the morning of your surgery when you wake up and every 2 hours until you get to the Short Stay unit. . If your blood sugar is less than 70 mg/dL, you will need to treat for low blood sugar: o Do not take insulin. o Treat a low blood sugar (less than 70 mg/dL) with  cup of clear juice (cranberry or apple), 4 glucose tablets, OR glucose gel. o Recheck blood sugar in 15 minutes after treatment (to make sure it is greater than 70 mg/dL). If your blood sugar is not greater than 70 mg/dL on recheck, call [phone] for further instructions. . Report your blood sugar to the short stay nurse when you get to Short Stay.  . If you are admitted to the hospital after surgery: o Your blood sugar will be checked by the staff and you will probably be given insulin after surgery (instead of oral diabetes medicines) to make sure you have good blood sugar levels. o The goal for blood sugar control after surgery is 80-180 mg/dL.   WHAT DO I DO ABOUT MY DIABETES MEDICATION?  Marland Kitchen Do not take oral diabetes medicines (pills) the  morning of surgery.  . THE NIGHT BEFORE SURGERY, take Metformin as usual.     . THE MORNING OF SURGERY :  DO NOT TAKE ANY DIABETIC MEDICATIONS DAY OF YOUR SURGERY                               You may not have any metal on your body including hair pins and              piercings  Do not wear jewelry, make-up, lotions, powders or perfumes, deodorant             Do not wear nail polish on your fingernails.  Do not shave  48 hours prior to surgery.            Do not bring valuables to the hospital. [place].  Contacts, dentures or bridgework may not be worn into surgery.  Leave suitcase in the car. After surgery it may be brought to your room.     Patients discharged the day of surgery will not be allowed  to drive home. IF YOU ARE HAVING SURGERY AND GOING HOME THE SAME DAY, YOU MUST HAVE AN ADULT TO DRIVE YOU HOME AND BE WITH YOU FOR 24 HOURS. YOU MAY GO HOME BY TAXI OR UBER OR ORTHERWISE, BUT AN ADULT MUST ACCOMPANY YOU HOME AND STAY WITH YOU FOR 24 HOURS.  Name and phone number of your driver:  Special Instructions: N/A              Please read over the following fact sheets you were given: ____________________________________________________________________          [facility] - Preparing for Surgery Before surgery, you can play an important role.  Because skin is not sterile, your skin needs to be as free of germs as possible.  You can reduce the number of germs on your skin by washing with CHG (chlorahexidine gluconate) soap before surgery.  CHG is an antiseptic cleaner which kills germs and bonds with the skin to continue killing germs even after washing. Please DO NOT use if you have an allergy to CHG or antibacterial soaps.  If your skin becomes reddened/irritated stop using the CHG and inform your nurse when you arrive at Short Stay. Do not shave (including legs and underarms) for at least 48 hours prior to the first CHG shower.  You may shave your face/neck. Please follow these instructions carefully:  1.  Shower with CHG Soap the night before surgery and the  morning of Surgery.  2.  If you choose to wash your hair, wash your hair first as usual with your  normal  shampoo.  3.  After you shampoo, rinse your hair and body thoroughly to remove the  shampoo.                           4.  Use CHG as you would any other liquid soap.  You can apply chg directly  to the skin and wash                       Gently with a scrungie or clean washcloth.  5.  Apply the CHG Soap to your body ONLY FROM THE NECK DOWN.   Do not use  on face/ open                           Wound or open sores. Avoid contact with eyes, ears mouth and genitals (private parts).                       Wash face,  Genitals  (private parts) with your normal soap.             6.  Wash thoroughly, paying special attention to the area where your surgery  will be performed.  7.  Thoroughly rinse your body with warm water from the neck down.  8.  DO NOT shower/wash with your normal soap after using and rinsing off  the CHG Soap.                9.  Pat yourself dry with a clean towel.            10.  Wear clean pajamas.            11.  Place clean sheets on your bed the night of your first shower and do not  sleep with pets. Day of Surgery : Do not apply any lotions/deodorants the morning of surgery.  Please wear clean clothes to the hospital/surgery center.  FAILURE TO FOLLOW THESE INSTRUCTIONS MAY RESULT IN THE CANCELLATION OF YOUR SURGERY PATIENT SIGNATURE_________________________________  NURSE SIGNATURE__________________________________

## 2020-11-08 ENCOUNTER — Encounter (HOSPITAL_COMMUNITY): Payer: Self-pay

## 2020-11-08 ENCOUNTER — Other Ambulatory Visit: Payer: Self-pay

## 2020-11-08 ENCOUNTER — Encounter (HOSPITAL_COMMUNITY)
Admission: RE | Admit: 2020-11-08 | Discharge: 2020-11-08 | Disposition: A | Discharge status: Home / Self Care | Payer: 59 | Source: Ambulatory Visit | Attending: Urology | Admitting: Urology

## 2020-11-08 DIAGNOSIS — Z01818 Encounter for other preprocedural examination: Secondary | ICD-10-CM | POA: Diagnosis not present

## 2020-11-08 HISTORY — DX: Anemia, unspecified: D64.9

## 2020-11-08 HISTORY — DX: Prediabetes: R73.03

## 2020-11-08 HISTORY — DX: Malignant (primary) neoplasm, unspecified: C80.1

## 2020-11-08 HISTORY — DX: Chronic kidney disease, unspecified: N18.9

## 2020-11-08 LAB — CBC
HCT: 38.4 % (ref 36.0–46.0)
Hemoglobin: 12.1 g/dL (ref 12.0–15.0)
MCH: 27.8 pg (ref 26.0–34.0)
MCHC: 31.5 g/dL (ref 30.0–36.0)
MCV: 88.1 fL (ref 80.0–100.0)
Platelets: 294 10*3/uL (ref 150–400)
RBC: 4.36 MIL/uL (ref 3.87–5.11)
RDW: 13.3 % (ref 11.5–15.5)
WBC: 5.2 10*3/uL (ref 4.0–10.5)
nRBC: 0.8 % — ABNORMAL HIGH (ref 0.0–0.2)

## 2020-11-08 LAB — BASIC METABOLIC PANEL
Anion gap: 6 (ref 5–15)
BUN: 14 mg/dL (ref 6–20)
CO2: 30 mmol/L (ref 22–32)
Calcium: 9.6 mg/dL (ref 8.9–10.3)
Chloride: 101 mmol/L (ref 98–111)
Creatinine, Ser: 0.75 mg/dL (ref 0.44–1.00)
GFR, Estimated: >60 mL/min (ref >60)
Glucose, Bld: 92 mg/dL (ref 70–99)
Potassium: 3.6 mmol/L (ref 3.5–5.1)
Sodium: 137 mmol/L (ref 135–145)

## 2020-11-08 LAB — HEMOGLOBIN A1C
Hgb A1c MFr Bld: 5.4 % (ref 4.8–5.6)
Mean Plasma Glucose: 108.28 mg/dL

## 2020-11-08 NOTE — Progress Notes (Addendum)
COVID Vaccine Completed: Yes Date COVID Vaccine completed: 10/15/20. [place] COVID vaccine manufacturer: Pfizer     PCP - Dr. Kelton Pillar Cardiologist - NO  Chest x-ray -  EKG -  Stress Test -  ECHO -  Cardiac Cath -  Pacemaker/ICD device last checked:  Sleep Study - Yes CPAP - NO  Fasting Blood Sugar -  Checks Blood Sugar _____ times a day  Blood Thinner Instructions: Aspirin Instructions: Last Dose:  Anesthesia review: Hx. HTN  Patient denies shortness of breath, fever, cough and chest pain at PAT appointment   Patient verbalized understanding of instructions that were given to them at the PAT appointment. Patient was also instructed that they will need to review over the PAT instructions again at home before surgery.

## 2020-11-11 ENCOUNTER — Other Ambulatory Visit (HOSPITAL_COMMUNITY)
Admission: RE | Admit: 2020-11-11 | Discharge: 2020-11-11 | Disposition: A | Discharge status: Home / Self Care | Payer: 59 | Source: Ambulatory Visit | Attending: Urology | Admitting: Urology

## 2020-11-11 DIAGNOSIS — Z01812 Encounter for preprocedural laboratory examination: Secondary | ICD-10-CM | POA: Diagnosis not present

## 2020-11-11 DIAGNOSIS — Z20822 Contact with and (suspected) exposure to covid-19: Secondary | ICD-10-CM | POA: Insufficient documentation

## 2020-11-11 LAB — SARS CORONAVIRUS 2 (TAT 6-24 HRS): SARS Coronavirus 2: NEGATIVE

## 2020-11-15 ENCOUNTER — Other Ambulatory Visit (HOSPITAL_COMMUNITY): Payer: Self-pay | Admitting: Physician Assistant

## 2020-11-15 ENCOUNTER — Inpatient Hospital Stay (HOSPITAL_COMMUNITY): Payer: 59

## 2020-11-15 ENCOUNTER — Encounter (HOSPITAL_COMMUNITY)
Admission: RE | Disposition: A | Discharge status: Home / Self Care | Payer: Self-pay | Source: Other Acute Inpatient Hospital | Attending: Urology

## 2020-11-15 ENCOUNTER — Inpatient Hospital Stay (HOSPITAL_COMMUNITY)
Admission: RE | Admit: 2020-11-15 | Discharge: 2020-11-16 | DRG: 658 | Disposition: A | Discharge status: Home / Self Care | Payer: 59 | Source: Other Acute Inpatient Hospital | Attending: Urology | Admitting: Urology

## 2020-11-15 ENCOUNTER — Other Ambulatory Visit: Payer: Self-pay

## 2020-11-15 ENCOUNTER — Encounter (HOSPITAL_COMMUNITY): Payer: Self-pay | Admitting: Urology

## 2020-11-15 DIAGNOSIS — C641 Malignant neoplasm of right kidney, except renal pelvis: Principal | ICD-10-CM | POA: Diagnosis present

## 2020-11-15 DIAGNOSIS — Z806 Family history of leukemia: Secondary | ICD-10-CM

## 2020-11-15 DIAGNOSIS — Z8249 Family history of ischemic heart disease and other diseases of the circulatory system: Secondary | ICD-10-CM

## 2020-11-15 DIAGNOSIS — Z8051 Family history of malignant neoplasm of kidney: Secondary | ICD-10-CM

## 2020-11-15 DIAGNOSIS — I10 Essential (primary) hypertension: Secondary | ICD-10-CM | POA: Diagnosis present

## 2020-11-15 DIAGNOSIS — Z800 Family history of malignant neoplasm of digestive organs: Secondary | ICD-10-CM

## 2020-11-15 DIAGNOSIS — N3946 Mixed incontinence: Secondary | ICD-10-CM | POA: Diagnosis present

## 2020-11-15 DIAGNOSIS — Z833 Family history of diabetes mellitus: Secondary | ICD-10-CM | POA: Diagnosis not present

## 2020-11-15 DIAGNOSIS — N2889 Other specified disorders of kidney and ureter: Secondary | ICD-10-CM | POA: Diagnosis present

## 2020-11-15 HISTORY — PX: ROBOT ASSISTED LAPAROSCOPIC NEPHRECTOMY: SHX5140

## 2020-11-15 LAB — HEMOGLOBIN AND HEMATOCRIT, BLOOD
HCT: 39.2 % (ref 36.0–46.0)
Hemoglobin: 12.2 g/dL (ref 12.0–15.0)

## 2020-11-15 LAB — TYPE AND SCREEN
ABO/RH(D): A POS
Antibody Screen: NEGATIVE

## 2020-11-15 LAB — CREATININE, SERUM
Creatinine, Ser: 0.82 mg/dL (ref 0.44–1.00)
GFR, Estimated: >60 mL/min (ref >60)

## 2020-11-15 SURGERY — NEPHRECTOMY, RADICAL, ROBOT-ASSISTED, LAPAROSCOPIC, ADULT
Anesthesia: General | Laterality: Right

## 2020-11-15 MED ORDER — ALBUMIN HUMAN 5 % IV SOLN: INTRAVENOUS | Status: DC | PRN

## 2020-11-15 MED ORDER — LACTATED RINGERS IV SOLN: INTRAVENOUS | Status: DC

## 2020-11-15 MED ORDER — HYOSCYAMINE SULFATE ER 0.375 MG PO TB12
0.3750 mg | ORAL_TABLET | Freq: Every day | ORAL | Status: DC
  Filled 2020-11-15: qty 1

## 2020-11-15 MED ORDER — PHENYLEPHRINE 40 MCG/ML (10ML) SYRINGE FOR IV PUSH (FOR BLOOD PRESSURE SUPPORT)
PREFILLED_SYRINGE | INTRAVENOUS | Status: AC
  Filled 2020-11-15: qty 10

## 2020-11-15 MED ORDER — ACETAMINOPHEN 10 MG/ML IV SOLN
INTRAVENOUS | Status: AC
  Filled 2020-11-15: qty 100

## 2020-11-15 MED ORDER — DIPHENHYDRAMINE HCL 12.5 MG/5ML PO ELIX: 12.5000 mg | ORAL_SOLUTION | Freq: Four times a day (QID) | ORAL | Status: DC | PRN

## 2020-11-15 MED ORDER — CHLORHEXIDINE GLUCONATE CLOTH 2 % EX PADS
6.0000 | MEDICATED_PAD | Freq: Every day | CUTANEOUS | Status: DC
  Administered 2020-11-15 – 2020-11-16 (×2): 6 via TOPICAL

## 2020-11-15 MED ORDER — DIPHENHYDRAMINE HCL 50 MG/ML IJ SOLN: 12.5000 mg | Freq: Four times a day (QID) | INTRAMUSCULAR | Status: DC | PRN

## 2020-11-15 MED ORDER — FENTANYL CITRATE (PF) 250 MCG/5ML IJ SOLN
INTRAMUSCULAR | Status: DC | PRN
  Administered 2020-11-15: 100 ug via INTRAVENOUS
  Administered 2020-11-15 (×3): 50 ug via INTRAVENOUS

## 2020-11-15 MED ORDER — ORAL CARE MOUTH RINSE: 15.0000 mL | Freq: Once | OROMUCOSAL | Status: AC

## 2020-11-15 MED ORDER — MEPERIDINE HCL 50 MG/ML IJ SOLN: 6.2500 mg | INTRAMUSCULAR | Status: DC | PRN

## 2020-11-15 MED ORDER — STERILE WATER FOR IRRIGATION IR SOLN
Status: DC | PRN
  Administered 2020-11-15: 1000 mL

## 2020-11-15 MED ORDER — ROCURONIUM BROMIDE 10 MG/ML (PF) SYRINGE
PREFILLED_SYRINGE | INTRAVENOUS | Status: AC
  Filled 2020-11-15: qty 10

## 2020-11-15 MED ORDER — ONDANSETRON HCL 4 MG/2ML IJ SOLN
4.0000 mg | INTRAMUSCULAR | Status: DC | PRN
  Administered 2020-11-15 – 2020-11-16 (×3): 4 mg via INTRAVENOUS
  Filled 2020-11-15 (×3): qty 2

## 2020-11-15 MED ORDER — MIDAZOLAM HCL 2 MG/2ML IJ SOLN
INTRAMUSCULAR | Status: AC
  Filled 2020-11-15: qty 2

## 2020-11-15 MED ORDER — LACTATED RINGERS IV SOLN: INTRAVENOUS | Status: DC | PRN

## 2020-11-15 MED ORDER — ACETAMINOPHEN 10 MG/ML IV SOLN
1000.0000 mg | Freq: Once | INTRAVENOUS | Status: DC | PRN
  Administered 2020-11-15: 1000 mg via INTRAVENOUS

## 2020-11-15 MED ORDER — ONDANSETRON HCL 4 MG/2ML IJ SOLN
INTRAMUSCULAR | Status: DC | PRN
  Administered 2020-11-15: 4 mg via INTRAVENOUS

## 2020-11-15 MED ORDER — HYDROCODONE-ACETAMINOPHEN 5-325 MG PO TABS
1.0000 | ORAL_TABLET | Freq: Four times a day (QID) | ORAL | 0 refills | Status: DC | PRN
Start: 2020-11-15 — End: 2020-11-15

## 2020-11-15 MED ORDER — HYDROMORPHONE HCL 1 MG/ML IJ SOLN
INTRAMUSCULAR | Status: AC
  Filled 2020-11-15: qty 1

## 2020-11-15 MED ORDER — BUPIVACAINE LIPOSOME 1.3 % IJ SUSP
20.0000 mL | Freq: Once | INTRAMUSCULAR | Status: AC
  Administered 2020-11-15: 20 mL
  Filled 2020-11-15: qty 20

## 2020-11-15 MED ORDER — CEFAZOLIN SODIUM-DEXTROSE 2-4 GM/100ML-% IV SOLN
2.0000 g | Freq: Once | INTRAVENOUS | Status: AC
  Administered 2020-11-15: 2 g via INTRAVENOUS
  Filled 2020-11-15: qty 100

## 2020-11-15 MED ORDER — HYDROMORPHONE HCL 1 MG/ML IJ SOLN
0.5000 mg | INTRAMUSCULAR | Status: DC | PRN
  Administered 2020-11-15: 1 mg via INTRAVENOUS
  Administered 2020-11-15: 0.5 mg via INTRAVENOUS
  Administered 2020-11-15: 1 mg via INTRAVENOUS
  Filled 2020-11-15 (×3): qty 1

## 2020-11-15 MED ORDER — HYDROMORPHONE HCL 1 MG/ML IJ SOLN
0.2500 mg | INTRAMUSCULAR | Status: DC | PRN
  Administered 2020-11-15 (×2): 0.5 mg via INTRAVENOUS
  Administered 2020-11-15 (×2): 0.25 mg via INTRAVENOUS
  Administered 2020-11-15: 0.5 mg via INTRAVENOUS

## 2020-11-15 MED ORDER — DEXAMETHASONE SODIUM PHOSPHATE 10 MG/ML IJ SOLN
INTRAMUSCULAR | Status: AC
  Filled 2020-11-15: qty 1

## 2020-11-15 MED ORDER — FENTANYL CITRATE (PF) 250 MCG/5ML IJ SOLN
INTRAMUSCULAR | Status: AC
  Filled 2020-11-15: qty 5

## 2020-11-15 MED ORDER — MIDAZOLAM HCL 5 MG/5ML IJ SOLN
INTRAMUSCULAR | Status: DC | PRN
  Administered 2020-11-15: 2 mg via INTRAVENOUS

## 2020-11-15 MED ORDER — OXYCODONE HCL 5 MG PO TABS
5.0000 mg | ORAL_TABLET | ORAL | Status: DC | PRN
  Administered 2020-11-15 – 2020-11-16 (×4): 5 mg via ORAL
  Filled 2020-11-15 (×4): qty 1

## 2020-11-15 MED ORDER — BELLADONNA ALKALOIDS-OPIUM 16.2-60 MG RE SUPP: 1.0000 | Freq: Four times a day (QID) | RECTAL | Status: DC | PRN

## 2020-11-15 MED ORDER — SODIUM CHLORIDE (PF) 0.9 % IJ SOLN
INTRAMUSCULAR | Status: DC | PRN
  Administered 2020-11-15: 20 mL

## 2020-11-15 MED ORDER — LIDOCAINE 2% (20 MG/ML) 5 ML SYRINGE
INTRAMUSCULAR | Status: AC
  Filled 2020-11-15: qty 5

## 2020-11-15 MED ORDER — PHENYLEPHRINE 40 MCG/ML (10ML) SYRINGE FOR IV PUSH (FOR BLOOD PRESSURE SUPPORT)
PREFILLED_SYRINGE | INTRAVENOUS | Status: DC | PRN
  Administered 2020-11-15: 40 ug via INTRAVENOUS
  Administered 2020-11-15: 80 ug via INTRAVENOUS

## 2020-11-15 MED ORDER — SUGAMMADEX SODIUM 200 MG/2ML IV SOLN
INTRAVENOUS | Status: DC | PRN
  Administered 2020-11-15: 160 mg via INTRAVENOUS

## 2020-11-15 MED ORDER — HEPARIN SODIUM (PORCINE) 5000 UNIT/ML IJ SOLN
5000.0000 [IU] | Freq: Three times a day (TID) | INTRAMUSCULAR | Status: DC
  Administered 2020-11-15 – 2020-11-16 (×3): 5000 [IU] via SUBCUTANEOUS
  Filled 2020-11-15 (×3): qty 1

## 2020-11-15 MED ORDER — SENNOSIDES-DOCUSATE SODIUM 8.6-50 MG PO TABS
2.0000 | ORAL_TABLET | Freq: Every day | ORAL | Status: DC
  Administered 2020-11-15: 2 via ORAL
  Filled 2020-11-15: qty 2

## 2020-11-15 MED ORDER — ACETAMINOPHEN 325 MG PO TABS: 325.0000 mg | ORAL_TABLET | Freq: Once | ORAL | Status: DC | PRN

## 2020-11-15 MED ORDER — PROPOFOL 10 MG/ML IV BOLUS
INTRAVENOUS | Status: DC | PRN
  Administered 2020-11-15: 160 mg via INTRAVENOUS

## 2020-11-15 MED ORDER — PROPOFOL 10 MG/ML IV BOLUS
INTRAVENOUS | Status: AC
  Filled 2020-11-15: qty 20

## 2020-11-15 MED ORDER — EZETIMIBE 10 MG PO TABS
10.0000 mg | ORAL_TABLET | Freq: Every day | ORAL | Status: DC
  Administered 2020-11-15 – 2020-11-16 (×2): 10 mg via ORAL
  Filled 2020-11-15 (×2): qty 1

## 2020-11-15 MED ORDER — SODIUM CHLORIDE 0.45 % IV SOLN: INTRAVENOUS | Status: DC

## 2020-11-15 MED ORDER — ACETAMINOPHEN 500 MG PO TABS
1000.0000 mg | ORAL_TABLET | Freq: Four times a day (QID) | ORAL | Status: AC
  Administered 2020-11-15 – 2020-11-16 (×4): 1000 mg via ORAL
  Filled 2020-11-15 (×4): qty 2

## 2020-11-15 MED ORDER — LIDOCAINE 2% (20 MG/ML) 5 ML SYRINGE
INTRAMUSCULAR | Status: DC | PRN
  Administered 2020-11-15: 80 mg via INTRAVENOUS

## 2020-11-15 MED ORDER — DEXAMETHASONE SODIUM PHOSPHATE 10 MG/ML IJ SOLN
INTRAMUSCULAR | Status: DC | PRN
  Administered 2020-11-15: 10 mg via INTRAVENOUS

## 2020-11-15 MED ORDER — LACTATED RINGERS IR SOLN
Status: DC | PRN
  Administered 2020-11-15: 1000 mL

## 2020-11-15 MED ORDER — CHLORHEXIDINE GLUCONATE 0.12 % MT SOLN
15.0000 mL | Freq: Once | OROMUCOSAL | Status: AC
  Administered 2020-11-15: 15 mL via OROMUCOSAL

## 2020-11-15 MED ORDER — AMISULPRIDE (ANTIEMETIC) 5 MG/2ML IV SOLN: 10.0000 mg | Freq: Once | INTRAVENOUS | Status: DC | PRN

## 2020-11-15 MED ORDER — EPHEDRINE 5 MG/ML INJ
INTRAVENOUS | Status: AC
  Filled 2020-11-15: qty 10

## 2020-11-15 MED ORDER — ACETAMINOPHEN 160 MG/5ML PO SOLN: 325.0000 mg | Freq: Once | ORAL | Status: DC | PRN

## 2020-11-15 MED ORDER — LIDOCAINE 20MG/ML (2%) 15 ML SYRINGE OPTIME
INTRAMUSCULAR | Status: DC | PRN
  Administered 2020-11-15: 1.5 mg/kg/h via INTRAVENOUS

## 2020-11-15 MED ORDER — ALBUMIN HUMAN 5 % IV SOLN
INTRAVENOUS | Status: AC
  Filled 2020-11-15: qty 250

## 2020-11-15 MED ORDER — HYOSCYAMINE SULFATE 0.125 MG SL SUBL
0.1250 mg | SUBLINGUAL_TABLET | SUBLINGUAL | Status: DC | PRN
  Filled 2020-11-15: qty 1

## 2020-11-15 MED ORDER — ROCURONIUM BROMIDE 10 MG/ML (PF) SYRINGE
PREFILLED_SYRINGE | INTRAVENOUS | Status: DC | PRN
  Administered 2020-11-15: 10 mg via INTRAVENOUS
  Administered 2020-11-15: 60 mg via INTRAVENOUS
  Administered 2020-11-15: 20 mg via INTRAVENOUS
  Administered 2020-11-15: 10 mg via INTRAVENOUS

## 2020-11-15 MED ORDER — SODIUM CHLORIDE (PF) 0.9 % IJ SOLN
INTRAMUSCULAR | Status: AC
  Filled 2020-11-15: qty 20

## 2020-11-15 MED ORDER — DOCUSATE SODIUM 100 MG PO CAPS: 100.0000 mg | ORAL_CAPSULE | Freq: Every day | ORAL | 0 refills | Status: AC | PRN

## 2020-11-15 MED ORDER — ONDANSETRON HCL 4 MG/2ML IJ SOLN
INTRAMUSCULAR | Status: AC
  Filled 2020-11-15: qty 2

## 2020-11-15 MED FILL — HYDROCODON-APAP 5-325: 5-325 | 3 days supply | Qty: 20 | Fill #0

## 2020-11-15 SURGICAL SUPPLY — 70 items
ADH SKN CLS APL DERMABOND .7 (GAUZE/BANDAGES/DRESSINGS) ×1
APL PRP STRL LF DISP 70% ISPRP (MISCELLANEOUS) ×1
BAG LAPAROSCOPIC 12 15 PORT 16 (BASKET) ×1 IMPLANT
BAG RETRIEVAL 12/15 (BASKET) ×2
CHLORAPREP W/TINT 26 (MISCELLANEOUS) ×2 IMPLANT
CLIP VESOLOCK LG 6/CT PURPLE (CLIP) ×2 IMPLANT
CLIP VESOLOCK MED LG 6/CT (CLIP) ×2 IMPLANT
CLIP VESOLOCK XL 6/CT (CLIP) ×4 IMPLANT
COVER SURGICAL LIGHT HANDLE (MISCELLANEOUS) ×2 IMPLANT
COVER TIP SHEARS 8 DVNC (MISCELLANEOUS) ×1 IMPLANT
COVER TIP SHEARS 8MM DA VINCI (MISCELLANEOUS) ×2
COVER WAND RF STERILE (DRAPES) ×2 IMPLANT
CUTTER ECHEON FLEX ENDO 45 340 (ENDOMECHANICALS) ×2 IMPLANT
DECANTER SPIKE VIAL GLASS SM (MISCELLANEOUS) ×2 IMPLANT
DERMABOND ADVANCED (GAUZE/BANDAGES/DRESSINGS) ×1
DERMABOND ADVANCED .7 DNX12 (GAUZE/BANDAGES/DRESSINGS) ×2 IMPLANT
DRAIN CHANNEL 15F RND FF 3/16 (WOUND CARE) IMPLANT
DRAPE ARM DVNC X/XI (DISPOSABLE) ×4 IMPLANT
DRAPE COLUMN DVNC XI (DISPOSABLE) ×1 IMPLANT
DRAPE DA VINCI XI ARM (DISPOSABLE) ×8
DRAPE DA VINCI XI COLUMN (DISPOSABLE) ×2
DRAPE INCISE IOBAN 66X45 STRL (DRAPES) ×2 IMPLANT
DRAPE SHEET LG 3/4 BI-LAMINATE (DRAPES) ×2 IMPLANT
ELECT PENCIL ROCKER SW 15FT (MISCELLANEOUS) ×2 IMPLANT
ELECT REM PT RETURN 15FT ADLT (MISCELLANEOUS) ×2 IMPLANT
EVACUATOR SILICONE 100CC (DRAIN) IMPLANT
GLOVE BIO SURGEON STRL SZ 6.5 (GLOVE) ×2 IMPLANT
GLOVE BIOGEL M STRL SZ7.5 (GLOVE) ×4 IMPLANT
GOWN STRL REUS W/TWL LRG LVL3 (GOWN DISPOSABLE) ×8 IMPLANT
HEMOSTAT SURGICEL 4X8 (HEMOSTASIS) ×1 IMPLANT
IRRIG SUCT STRYKERFLOW 2 WTIP (MISCELLANEOUS) ×2
IRRIGATION SUCT STRKRFLW 2 WTP (MISCELLANEOUS) ×1 IMPLANT
KIT BASIN OR (CUSTOM PROCEDURE TRAY) ×2 IMPLANT
KIT TURNOVER KIT A (KITS) IMPLANT
LOOP VESSEL MAXI BLUE (MISCELLANEOUS) IMPLANT
NDL INSUFFLATION 14GA 120MM (NEEDLE) ×1 IMPLANT
NEEDLE INSUFFLATION 14GA 120MM (NEEDLE) ×2 IMPLANT
PATTIES SURGICAL .5 X3 (DISPOSABLE) ×2 IMPLANT
PENCIL SMOKE EVACUATOR (MISCELLANEOUS) IMPLANT
PORT ACCESS TROCAR AIRSEAL 12 (TROCAR) ×1 IMPLANT
PORT ACCESS TROCAR AIRSEAL 5M (TROCAR) ×1
PROTECTOR NERVE ULNAR (MISCELLANEOUS) ×5 IMPLANT
SEAL CANN UNIV 5-8 DVNC XI (MISCELLANEOUS) ×4 IMPLANT
SEAL XI 5MM-8MM UNIVERSAL (MISCELLANEOUS) ×8
SEALER VESSEL DA VINCI XI (MISCELLANEOUS) ×2
SEALER VESSEL EXT DVNC XI (MISCELLANEOUS) IMPLANT
SET TRI-LUMEN FLTR TB AIRSEAL (TUBING) ×2 IMPLANT
SOL ANTI FOG 6CC (MISCELLANEOUS) IMPLANT
SOLUTION ANTI FOG 6CC (MISCELLANEOUS) ×1
SOLUTION ELECTROLUBE (MISCELLANEOUS) ×2 IMPLANT
SPONGE LAP 4X18 RFD (DISPOSABLE) ×2 IMPLANT
STAPLE RELOAD 45 WHT (STAPLE) ×2 IMPLANT
STAPLE RELOAD 45MM WHITE (STAPLE) ×4
SUT ETHILON 3 0 PS 1 (SUTURE) IMPLANT
SUT MNCRL AB 4-0 PS2 18 (SUTURE) ×4 IMPLANT
SUT PDS AB 1 CT1 27 (SUTURE) ×6 IMPLANT
SUT VIC AB 0 CT1 27 (SUTURE) ×2
SUT VIC AB 0 CT1 27XBRD ANTBC (SUTURE) ×1 IMPLANT
SUT VIC AB 2-0 SH 27 (SUTURE) ×2
SUT VIC AB 2-0 SH 27X BRD (SUTURE) IMPLANT
SUT VICRYL 0 UR6 27IN ABS (SUTURE) IMPLANT
SYR CONTROL 10ML LL (SYRINGE) ×1 IMPLANT
TOWEL OR 17X26 10 PK STRL BLUE (TOWEL DISPOSABLE) ×2 IMPLANT
TOWEL OR NON WOVEN STRL DISP B (DISPOSABLE) ×1 IMPLANT
TRAY FOLEY MTR SLVR 16FR STAT (SET/KITS/TRAYS/PACK) ×2 IMPLANT
TRAY LAPAROSCOPIC (CUSTOM PROCEDURE TRAY) ×2 IMPLANT
TROCAR BLADELESS OPT 5 100 (ENDOMECHANICALS) ×2 IMPLANT
TROCAR UNIVERSAL OPT 12M 100M (ENDOMECHANICALS) ×2 IMPLANT
TROCAR XCEL 12X100 BLDLESS (ENDOMECHANICALS) ×2 IMPLANT
WATER STERILE IRR 1000ML POUR (IV SOLUTION) ×2 IMPLANT

## 2020-11-15 NOTE — Anesthesia Procedure Notes (Signed)
Procedure Name: Intubation Date/Time: 11/15/2020 7:29 AM Performed by: Niel Hummer, CRNA Pre-anesthesia Checklist: Patient identified, Emergency Drugs available, Suction available and Patient being monitored Patient Re-evaluated:Patient Re-evaluated prior to induction Oxygen Delivery Method: Circle system utilized Preoxygenation: Pre-oxygenation with 100% oxygen Induction Type: IV induction Ventilation: Mask ventilation without difficulty Laryngoscope Size: Miller and 2 Grade View: Grade I Tube type: Oral Tube size: 7.0 mm Number of attempts: 1 Airway Equipment and Method: Stylet and Oral airway Placement Confirmation: ETT inserted through vocal cords under direct vision,  positive ETCO2 and breath sounds checked- equal and bilateral Secured at: 22 cm Tube secured with: Tape Dental Injury: Teeth and Oropharynx as per pre-operative assessment  Comments: Placed by Hilda Blades

## 2020-11-15 NOTE — Anesthesia Preprocedure Evaluation (Addendum)
Anesthesia Evaluation  Patient identified by MRN, date of birth, ID band Patient awake    Reviewed: Allergy & Precautions, NPO status , Patient's Chart, lab work & pertinent test results  Airway Mallampati: III  TM Distance: >3 FB Neck ROM: Full    Dental  (+) Teeth Intact, Dental Advisory Given   Pulmonary neg pulmonary ROS,    breath sounds clear to auscultation       Cardiovascular hypertension,  Rhythm:Regular Rate:Normal     Neuro/Psych negative neurological ROS  negative psych ROS   GI/Hepatic negative GI ROS, Neg liver ROS,   Endo/Other    Renal/GU Renal InsufficiencyRenal disease     Musculoskeletal negative musculoskeletal ROS (+)   Abdominal Normal abdominal exam  (+)   Peds  Hematology   Anesthesia Other Findings   Reproductive/Obstetrics                            Anesthesia Physical Anesthesia Plan  ASA: II  Anesthesia Plan: General   Post-op Pain Management:    Induction: Intravenous  PONV Risk Score and Plan: 4 or greater and Ondansetron, Dexamethasone, Midazolam and Scopolamine patch - Pre-op  Airway Management Planned: Oral ETT  Additional Equipment: None  Intra-op Plan:   Post-operative Plan: Extubation in OR  Informed Consent: I have reviewed the patients History and Physical, chart, labs and discussed the procedure including the risks, benefits and alternatives for the proposed anesthesia with the patient or authorized representative who has indicated his/her understanding and acceptance.     Dental advisory given  Plan Discussed with: CRNA  Anesthesia Plan Comments: (- 2 IV's - T&S  Lab Results      Component                Value               Date                      WBC                      5.2                 11/08/2020                HGB                      12.1                11/08/2020                HCT                      38.4                 11/08/2020                MCV                      88.1                11/08/2020                PLT                      294  11/08/2020           )       Anesthesia Quick Evaluation

## 2020-11-15 NOTE — Anesthesia Postprocedure Evaluation (Signed)
Anesthesia Post Note  Patient: [name]  Procedure(s) Performed: XI ROBOTIC ASSISTED LAPAROSCOPIC RADICAL NEPHRECTOMY (Right )     Patient location during evaluation: PACU Anesthesia Type: General Level of consciousness: awake and alert Pain management: pain level controlled Vital Signs Assessment: post-procedure vital signs reviewed and stable Respiratory status: spontaneous breathing, nonlabored ventilation, respiratory function stable and patient connected to nasal cannula oxygen Cardiovascular status: blood pressure returned to baseline and stable Postop Assessment: no apparent nausea or vomiting Anesthetic complications: no   No complications documented.  Last Vitals:  Vitals:   11/15/20 1115 11/15/20 1134  BP: (!) 158/80 (!) 169/93  Pulse: 72 60  Resp: 14 14  Temp:  36.5 C  SpO2: 100% 100%    Last Pain:  Vitals:   11/15/20 1134  TempSrc: Oral  PainSc:                  Effie Berkshire

## 2020-11-15 NOTE — Plan of Care (Signed)
  Problem: Health Behavior/Discharge Planning: Goal: Ability to manage health-related needs will improve Outcome: Progressing   Problem: Activity: Goal: Risk for activity intolerance will decrease Outcome: Progressing   Problem: Nutrition: Goal: Adequate nutrition will be maintained Outcome: Progressing   Problem: Pain Managment: Goal: General experience of comfort will improve Outcome: Progressing   Problem: Safety: Goal: Ability to remain free from injury will improve Outcome: Progressing   Problem: Skin Integrity: Goal: Risk for impaired skin integrity will decrease Outcome: Progressing   Problem: Education: Goal: Knowledge of the prescribed therapeutic regimen will improve Outcome: Progressing   Problem: Bowel/Gastric: Goal: Gastrointestinal status for postoperative course will improve Outcome: Progressing   Problem: Clinical Measurements: Goal: Postoperative complications will be avoided or minimized Outcome: Progressing   Problem: Respiratory: Goal: Ability to achieve and maintain a regular respiratory rate will improve Outcome: Progressing   Problem: Skin Integrity: Goal: Demonstration of wound healing without infection will improve Outcome: Progressing   Problem: Urinary Elimination: Goal: Ability to avoid or minimize complications of infection will improve Outcome: Progressing Goal: Ability to achieve and maintain urine output will improve Outcome: Progressing

## 2020-11-15 NOTE — Addendum Note (Signed)
Addendum  created 11/15/20 1222 by Niel Hummer, CRNA   Charge Capture section accepted

## 2020-11-15 NOTE — Progress Notes (Signed)
Day of Surgery Subjective: C/o right lower back pain and incisional pain. Pain controled. No nausea or emesis. Tolerating clears. Feels hungry.  Objective: Vital signs in last 24 hours: Temp:  [97.3 F (36.3 C)-98.7 F (37.1 C)] 97.6 F (36.4 C) (11/22 1543) Pulse Rate:  [55-85] 63 (11/22 1543) Resp:  [12-18] 16 (11/22 1543) BP: (142-183)/(72-99) 142/72 (11/22 1543) SpO2:  [99 %-100 %] 100 % (11/22 1543) Weight:  [70.8 kg] 70.8 kg (11/22 0546)  Intake/Output from previous day: No intake/output data recorded. Intake/Output this shift: Total I/O In: 2202.2 [P.O.:360; I.V.:1492.2; IV Piggyback:350] Out: 940 [Urine:915; Blood:25]  Physical Exam:  General: Alert and oriented CV: RRR Lungs: Clear Abdomen: Soft, ND, ATTP; inc c/d/I Ext: NT, No erythema  Lab Results: Recent Labs    11/15/20 1052  HGB 12.2  HCT 39.2   BMET Recent Labs    11/15/20 1207  CREATININE 0.82     Studies/Results: No results found.  Assessment/Plan: 1. Right renal mass s/p robotic assisted laparoscopic right radical nephrectomy (adrenal sparing) 11/15/2020  -Pain control prn -Advance to reg diet -MIVF -Labs in AM -VT tomorrow AM -OOB, amb, IS, SQH   LOS: 0 days   Matt R. Kenia Teagarden MD 11/15/2020, 5:40 PM [facility]  Pager: [phone]

## 2020-11-15 NOTE — Transfer of Care (Signed)
Immediate Anesthesia Transfer of Care Note  Patient: [name]  Procedure(s) Performed: XI ROBOTIC ASSISTED LAPAROSCOPIC RADICAL NEPHRECTOMY (Right )  Patient Location: PACU  Anesthesia Type:General  Level of Consciousness: awake, alert  and oriented  Airway & Oxygen Therapy: Patient Spontanous Breathing and Patient connected to face mask oxygen  Post-op Assessment: Report given to RN, Post -op Vital signs reviewed and stable and Patient moving all extremities X 4  Post vital signs: Reviewed and stable  Last Vitals:  Vitals Value Taken Time  BP    Temp    Pulse 84 11/15/20 1013  Resp 12 11/15/20 1013  SpO2 100 % 11/15/20 1013  Vitals shown include unvalidated device data.  Last Pain:  Vitals:   11/15/20 5750  TempSrc:   PainSc: 0-No pain         Complications: No complications documented.

## 2020-11-15 NOTE — Discharge Instructions (Signed)

## 2020-11-15 NOTE — Op Note (Signed)
Operative Note  Preoperative diagnosis:  1.  Right renal mass  Postoperative diagnosis: 1.  Right renal mass  Procedure(s): 1.  Robotic assisted laparoscopic right radical nephrectomy (adrenal sparing)  Surgeon: Rexene Alberts, MD  Assistants:  Debbrah Alar, PA  Anesthesia:  General  Complications:  None  EBL:  64ml  Specimens: 1.  ID Type Source Tests Collected by Time Destination  1 : Right kidney Tissue PATH GU biopsy SURGICAL PATHOLOGY Julia Lima, MD 11/15/2020 [phone]    Drains/Catheters: 1.  16 Fr foley catheter  Intraoperative findings:   1. Right upper pole mass. No involvement of liver or adrenal.  Indication:  [name] is a 49 y.o. female with a 5.2cm right upper pole renal mass presenting for a robotic assisted laparoscopic right radical nephrectomy.  Description of procedure: The indications, alternatives, benefits and risks were discussed with the patient and informed consent was obtained.  The patient was brought onto the operating room table, positioned supine and secured to the bed with a safety strap.  All pressure points were carefully padded and pneumatic compression devices were placed on the lower extremities.  After the administration of intravenous antibiotics and general endotracheal anesthesia, a 16 French urethral catheter was inserted to drain the bladder.  The patient was repositioned in the left lateral decubitus with the right side elevated at a 70 degree angle with the left lower leg flexed and the right upper leg extended.  An axillary roll was positioned to protect the brachial plexus and a gel pad was placed to support the back.  Multiple pillows were used to pad beneath and between the lower extremities and to ensure adequate cushioning. The right arm was placed in an armrest and carefully padded. The patient was secured in place across the hips, chest and legs with foam padding and silk tape, and the table was flexed.  The patient was prepped  and draped in the standard sterile manner.  The radiographic images were in the room.  Timeout was completed verifying the correct patient, surgical procedure, site and positioning, prior to beginning the procedure.  Pneumoperitoneum was introduced by placing a Veress needle just lateral to the rectus belly into the abdomen and insufflating with CO2 to a pressure of 15 mmHg.  We then placed a 70mm visual obturator into the expected location of the left arm. The 0 degree lens was inserted under direct visualization.  The abdominal cavity was examined for any signs of injury, adhesions and identification of anatomic landmarks. There were no significant adhesions. The camera trocar was placed approximately 7-1/2 cm inferior and 2 cm medially to the planned position of the right robotic arm which was approximately 2 fingerbreadths inferior to the costal margin.  We then placed our right robotic trocar, left robotic trocar and fourth arm trocar.  A 12 mm assistant port was placed superior and medial to the umbilicus and a 5 mm assistant port was placed just inferior to the xiphoid process to use as a liver retractor.  The robot was then docked.  The white line of Toldt was incised and the colon was reflected medially, exposing Gerota's fascia.  The hepatic flexure was mobilized.  This allowed for further retraction of the liver.  The duodenum was kocherized and the inferior vena cava was visualized.  The retroperitoneal fascia overlying the renal vessels was carefully separated, exposing the underlying renal vein.  I was able to achieve an adequate lift using the fourth arm to elevate the kidney,  further exposing the hilum.  The right renal vein and right renal artery were carefully dissected and mobilized.  A single weck clip below and a laparoscopic battery-operated stapler with a vascular load was used to control and ligate the right renal artery.  A separate vascular load was used to secure the right renal vein.   Next, the vessel sealer was used to mobilize the upper pole and any remaining attachments to the right kidney. Special attention was given to the cranial connections to the adrenal gland, which were carefully divided using the vessel sealer, completely freeing the adrenal off the superior pole of the kidney.  The right ureter was divided between clips.   The kidney was placed in an Endo Catch bag.  The renal fossa and remainder of the operating field were inspected for bleeding or injury.  The insufflation pressure was reduced, again confirming the absence of bleeding.  Excellent hemostasis was obtained.  A mini-Gibson incision was made at the site of the fourth arm trocar.  The fascia was opened and the specimen was removed after splitting the muscle slightly. The ports were removed under direct vision.  Carter-Thomason device was used to close the 12 mm port.  All wounds were copiously irrigated and then infiltrated with Exparel.  The muscle was reapproximated using 0 Vicryl as a first layer. 1-0 PDS was used to close the fascia as a second layer.  The skin incisions were reapproximated with 4-0 Monocryl.  Dermabond was then applied on the skin.  At the end of the procedure, all counts were correct.  The patient tolerated the procedure well and was taken to the recovery room in satisfactory condition.  [facility]  Pager: [phone]

## 2020-11-15 NOTE — Progress Notes (Signed)
1 Day Post-Op Subjective: C/o incisional pain. Pain controlled. Some nausea and emesis after narcotics. Tolerating clears.  Objective: Vital signs in last 24 hours: Temp:  [97.3 F (36.3 C)-99 F (37.2 C)] 99 F (37.2 C) (11/23 0435) Pulse Rate:  [54-85] 54 (11/23 0435) Resp:  [12-18] 14 (11/23 0435) BP: (135-183)/(67-99) 168/81 (11/23 0435) SpO2:  [99 %-100 %] 100 % (11/23 0435)  Intake/Output from previous day: 11/22 0701 - 11/23 0700 In: 3236.4 [P.O.:360; I.V.:2526.4; IV Piggyback:350] Out: 2815 [Urine:2340; Emesis/NG output:450; Blood:25] Intake/Output this shift: No intake/output data recorded.   UOP: 1.3L  Physical Exam:  General: Alert and oriented CV: RRR Lungs: Clear Abdomen: Soft, ND, ATTP; inc c/d/I Ext: NT, No erythema  Lab Results: Recent Labs    11/15/20 1052 11/16/20 0422  HGB 12.2 11.4*  HCT 39.2 36.3   BMET Recent Labs    11/15/20 1207 11/16/20 0422  NA  --  135  K  --  3.3*  CL  --  100  CO2  --  24  GLUCOSE  --  108*  BUN  --  13  CREATININE 0.82 1.01*  CALCIUM  --  9.1     Studies/Results: No results found.  Assessment/Plan: 1. Right renal mass s/p robotic assisted laparoscopic right radical nephrectomy (adrenal sparing) 11/15/2020  -Pain control prn -Reg diet as tolerated -Saline lock -Reviewed labs, appropriate. Hgb 11.4 from 12.1. Cr 1.01. Replete K. -VT -OOB, amb, IS, SQH -Possible home today vs tomorrow   LOS: 1 day   Matt R. Malvika Tung MD 11/16/2020, 7:45 AM [facility]  Pager: [phone]

## 2020-11-15 NOTE — Plan of Care (Signed)
  Problem: Health Behavior/Discharge Planning: Goal: Ability to manage health-related needs will improve Outcome: Progressing   Problem: Activity: Goal: Risk for activity intolerance will decrease Outcome: Progressing   Problem: Nutrition: Goal: Adequate nutrition will be maintained Outcome: Progressing   Problem: Elimination: Goal: Will not experience complications related to bowel motility Outcome: Progressing Goal: Will not experience complications related to urinary retention Outcome: Progressing   Problem: Pain Managment: Goal: General experience of comfort will improve Outcome: Progressing   Problem: Safety: Goal: Ability to remain free from injury will improve Outcome: Progressing   Problem: Skin Integrity: Goal: Risk for impaired skin integrity will decrease Outcome: Progressing   Problem: Education: Goal: Knowledge of the prescribed therapeutic regimen will improve Outcome: Progressing   Problem: Bowel/Gastric: Goal: Gastrointestinal status for postoperative course will improve Outcome: Progressing   Problem: Clinical Measurements: Goal: Postoperative complications will be avoided or minimized Outcome: Progressing   Problem: Respiratory: Goal: Ability to achieve and maintain a regular respiratory rate will improve Outcome: Progressing   Problem: Skin Integrity: Goal: Demonstration of wound healing without infection will improve Outcome: Progressing   Problem: Urinary Elimination: Goal: Ability to avoid or minimize complications of infection will improve Outcome: Progressing Goal: Ability to achieve and maintain urine output will improve Outcome: Progressing

## 2020-11-15 NOTE — H&P (Signed)
Office Visit Report     11/04/2020   --------------------------------------------------------------------------------   [name]  MRN: [id number]  DOB: [birth year], 49 year old Female  SSN: [id number]   PRIMARY CARE:    REFERRING:  Elaine C. Laurann [place], MD  PROVIDER:  Rexene Alberts, M.D.  LOCATION:  [facility] [phone]     --------------------------------------------------------------------------------   CC/HPI: [name] is a 49 year old female seen in followup for a RIGHT renal mass.   She states she initially developed left flank pain which prompted a visit to the emergency department where a CT A/P on 07/21/2020 revealed contour deformity of the upper right kidney with rounded homogenous isodense lesion measuring 5.3 cm.   She then developed right flank pain which prompted an additional visit to the ED in September where a CT A/P 09/16/2020 revealed complex 5.2 cm lesion in the upper pole of the right kidney. Which could reflect a complex cyst or solid renal lesion.   She underwent MRI A/P 10/03/2020 which revealed a 5.2 cm solid mass in the upper pole of the right kidney highly suspicious for renal cell carcinoma. There is no evidence for metastatic disease. She is also incidentally found to have a 1.4 cm benign hemangioma in the posterior right hepatic lobe. She also found to have a 3.7 cm hemorrhagic right ovarian cyst. She had a mildly enlarged left ovary suggesting hemorrhage.   She elected to proceed with renal biopsy. Biopsy was performed 10/27/2020 which resulted renal cell carcinoma.   She now currently denies abdominal or flank pain. She denies gross hematuria. She denies a personal history of malignancy. Her father was diagnosed with colon cancer. Her uncle had kidney cancer. Her grandmother had pancreatic cancer. Her uncle also had prostate cancer.   She denies a cardiac or pulmonary history. She has hypertension hyperlipidemia which are well controlled. Her  previous abdominal surgeries are a hysterectomy in 2021 and a laparoscopic cholecystectomy in 2018. She also had a ovarian cystectomy in 2014.       ALLERGIES: No Allergies    MEDICATIONS: Hydrochlorothiazide 12.5 mg tablet  [employer]  Ezetimibe 10 mg tablet  Hyoscyamine Sulfate Er 0.375 mg tablet, extended release 12 hr  Immune 24Hr+  Multivitamin  Super Beets     GU PSH: Hysterectomy       PSH Notes: Sinus Surgery, Ovarian Cystectomy   NON-GU PSH: Remove Gallbladder Remove Ovarian Cyst(s) - 2014 Sinus surgery     GU PMH: Right renal neoplasm - 10/12/2020 Mixed incontinence, Urge and stress incontinence - 2014 Stress Incontinence, Female stress incontinence - 2014      PMH Notes:  1898-12-25 00:00:00 - Note: Normal Routine History And Physical Adult   NON-GU PMH: Muscle weakness (generalized), Muscle weakness - 2014 Other lack of coordination, Other lack of coordination - 2014 Personal history of other endocrine, nutritional and metabolic disease, History of hypercholesterolemia - 2014 Hypercholesterolemia Hypertension    FAMILY HISTORY: Colon Cancer - Father Family Health Status Children is 1 daughter and 1 - [place] _4__ Living Daughter - [place] Family Kidney Cancer - Uncle Kidney Failure - Grandmother Kidney Stones - Father leukemia - Grandfather pancreatic cancer - Grandmother Prostate Cancer - Uncle   SOCIAL HISTORY: Marital Status: Married Preferred Language: English; Ethnicity: Not Hispanic Or Latino; Race: Black or African American Current Smoking Status: Patient has never smoked.   Tobacco Use Assessment Completed: Used Tobacco in last 30 days? Drinks 1 drink per week.  Drinks 1 caffeinated drink per day.     Notes: Activities Of Daily Living, Self-reliant In Usual Daily Activities, Living Independently With Spouse, Exercise Habits, Marital History - Currently Married, Occupation:, Caffeine Use, Being A  Social Drinker, Never A Smoker   REVIEW OF SYSTEMS:    GU Review Female:   Patient denies frequent urination, hard to postpone urination, burning /pain with urination, get up at night to urinate, leakage of urine, stream starts and stops, trouble starting your stream, have to strain to urinate, and being pregnant.  Gastrointestinal (Upper):   Patient denies nausea, vomiting, and indigestion/ heartburn.  Gastrointestinal (Lower):   Patient denies diarrhea and constipation.  Constitutional:   Patient denies fever, night sweats, weight loss, and fatigue.  Skin:   Patient denies skin rash/ lesion and itching.  Eyes:   Patient denies blurred vision and double vision.  Ears/ Nose/ Throat:   Patient denies sore throat and sinus problems.  Hematologic/Lymphatic:   Patient denies swollen glands and easy bruising.  Cardiovascular:   Patient denies leg swelling and chest pains.  Respiratory:   Patient denies cough and shortness of breath.  Endocrine:   Patient denies excessive thirst.  Musculoskeletal:   Patient denies back pain and joint pain.  Neurological:   Patient denies headaches and dizziness.  Psychologic:   Patient denies depression and anxiety.   VITAL SIGNS:      11/04/2020 03:28 PM  Weight 153 lb / 69.4 kg  Height 64.5 in / 163.83 cm  BP 153/82 mmHg  Pulse 65 /min  Temperature 97.3 F / 36.2 C  BMI 25.9 kg/m   MULTI-SYSTEM PHYSICAL EXAMINATION:    Constitutional: Well-nourished. No physical deformities. Normally developed. Good grooming.  Respiratory: No labored breathing, no use of accessory muscles.   Cardiovascular: Normal temperature, normal extremity pulses, no swelling, no varicosities.  Gastrointestinal: No mass, no tenderness, no rigidity, non obese abdomen.     Complexity of Data:  Source Of History:  Patient, Medical Record Summary  Records Review:   Pathology Reports, Previous Doctor Records, Previous Hospital Records  Urine Test Review:   Urinalysis  X-Ray Review:  C.T. Abdomen/Pelvis: Reviewed Films. Reviewed Report.  MRI Abdomen: Reviewed Films. Reviewed Report.     PROCEDURES:          Urinalysis - 81003 Dipstick Dipstick Cont'd  Color: Yellow Bilirubin: Neg mg/dL  Appearance: Clear Ketones: Neg mg/dL  Specific Gravity: 1.020 Blood: Neg ery/uL  pH: 6.5 Protein: Neg mg/dL  Glucose: Neg mg/dL Urobilinogen: 0.2 mg/dL    Nitrites: Neg    Leukocyte Esterase: Neg leu/uL    ASSESSMENT:      ICD-10 Details  1 GU:   Right renal neoplasm - D49.511    PLAN:           Document Letter(s):  Created for Patient: Clinical Summary         Notes:   1. New RIGHT renal mass: diagnosed on MRI A/P 10/03/2020.  -Biopsy proven renal cell carcinoma s/p Right renal mass biopsy on 10/27/2020  -Location: Right upper pole  -Nephrometry score: 9P  -Hb: pending  -ECOG PS: 0  -Creatinine: 0.71 on 09/16/2020  -Mediastinal LAD: no  -Liver involvement: no   We discussed that given the size of her mass which is over 5 cm as well as the complexity of the location, this would make for a very difficult partial nephrectomy even in the most experience hands. I did offer her a consultation to one of my partners  to discuss a hemi-nephrectomy. She declines at this time.  -I recommend a RIGHT robotic assisted laparoscopic radical nephrectomy.  -She is scheduled for surgery on 11/15/2020   We discussed the risks and benefits of laparoscopic nephrectomy including, but not limited to, infection, bleeding, blood transfusion, possible conversion to open nephrectomy, possible injury to surrounding organs requiring immediate or delayed repair, possible benign path or positive surgical margins, chronic kidney disease with subsequent increased risk of cardiovascular morbidity and mortality, and global anesthesia risks including but not limited to CVA, MI, DVT, PE, pneumonia, and death. The patient expressed understanding and desire to proceed.   CC: Kelton Pillar, MD         Next  Appointment:      Next Appointment: 11/04/2020 02:45 PM    Appointment Type: Office Visit Established Patient    Location: [facility] [phone]    Provider: Rexene Alberts, M.D.    Reason for Visit: 1-2 week f/u       Signed by Rexene Alberts, M.D. on 11/04/20 at 4:07 PM (EST)  Urology Preoperative H&P   Chief Complaint: Right renal mass  History of Present Illness: [name] is a 49 y.o. female with right renal mass here for right robotic assisted laparoscopic radical nephrectomy. Denies fevers or chills.    Past Medical History:  Diagnosis Date  . Allergy    takes Claritin and uses Flonase daily  . Anemia   . Cancer ([place])    kidney  . Chronic kidney disease   . History of bronchitis as a child   . Hyperlipidemia    not on any meds  . Hypertension    takes HCTZ daily  . PCOS (polycystic ovarian syndrome)   . Pre-diabetes     Past Surgical History:  Procedure Laterality Date  . CERVICAL CERCLAGE    . CHOLECYSTECTOMY N/A 04/13/2017   Procedure: LAPAROSCOPIC CHOLECYSTECTOMY;  Surgeon: Clovis Riley, MD;  Location: [facility];  Service: General;  Laterality: N/A;  . COLONOSCOPY    . cyst removed from ovary    . DILATION AND CURETTAGE OF UTERUS    . NASAL SINUS SURGERY    . REFRACTIVE SURGERY    . ROBOTIC ASSISTED LAPAROSCOPIC HYSTERECTOMY AND SALPINGECTOMY Bilateral 08/28/2019   Procedure: XI ROBOTIC ASSISTED LAPAROSCOPIC HYSTERECTOMY AND SALPINGECTOMY;  Surgeon: Servando Salina, MD;  Location: [facility];  Service: Gynecology;  Laterality: Bilateral;  Gaylord Shih RNFA  . UTERINE ARTERY EMBOLIZATION    . wisdom teeth extracted      Allergies: No Known Allergies  Family History  Problem Relation Age of Onset  . Diabetes Father   . Hypertension Father   . Cancer Father   . Heart disease Father   . Cancer Other   . Sleep apnea Other     Social History:  reports that she has never smoked. She has never used smokeless tobacco.  She reports current alcohol use. She reports that she does not use drugs.  ROS: A complete review of systems was performed.  All systems are negative except for pertinent findings as noted.  Physical Exam:  Vital signs in last 24 hours: Temp:  [98.7 F (37.1 C)] 98.7 F (37.1 C) (11/22 0551) Pulse Rate:  [61] 61 (11/22 0551) Resp:  [16] 16 (11/22 0551) BP: (145)/(73) 145/73 (11/22 0551) SpO2:  [100 %] 100 % (11/22 0551) Weight:  [70.8 kg] 70.8 kg (11/22 0546) Constitutional:  Alert and oriented, No acute distress Cardiovascular: Regular  rate and rhythm Respiratory: Normal respiratory effort, Lungs clear bilaterally GI: Abdomen is soft, nontender, nondistended, no abdominal masses GU: No CVA tenderness Lymphatic: No lymphadenopathy Neurologic: Grossly intact, no focal deficits Psychiatric: Normal mood and affect  Laboratory Data:  No results for input(s): WBC, HGB, HCT, PLT in the last 72 hours.  No results for input(s): NA, K, CL, GLUCOSE, BUN, CALCIUM, CREATININE in the last 72 hours.  Invalid input(s): CO3   No results found for this or any previous visit (from the past 24 hour(s)). Recent Results (from the past 240 hour(s))  SARS CORONAVIRUS 2 (TAT 6-24 HRS) Nasopharyngeal Nasopharyngeal Swab     Status: None   Collection Time: 11/11/20  3:11 PM   Specimen: Nasopharyngeal Swab  Result Value Ref Range Status   SARS Coronavirus 2 NEGATIVE NEGATIVE Final    Comment: (NOTE) SARS-CoV-2 target nucleic acids are NOT DETECTED.  The SARS-CoV-2 RNA is generally detectable in upper and lower respiratory specimens during the acute phase of infection. Negative results do not preclude SARS-CoV-2 infection, do not rule out co-infections with other pathogens, and should not be used as the sole basis for treatment or other patient management decisions. Negative results must be combined with clinical observations, patient history, and epidemiological information. The expected result  is Negative.  Fact Sheet for Patients: SugarRoll.be  Fact Sheet for Healthcare Providers: https://www.woods-mathews.com/  This test is not yet approved or cleared by the [place] FDA and  has been authorized for detection and/or diagnosis of SARS-CoV-2 by FDA under an Emergency Use Authorization (EUA). This EUA will remain  in effect (meaning this test can be used) for the duration of the COVID-19 declaration under Se ction 564(b)(1) of the Act, 21 U.S.C. section 360bbb-3(b)(1), unless the authorization is terminated or revoked sooner.  Performed at [facility], [address] [place], [place] [address]     Renal Function: Recent Labs    11/08/20 8003  CREATININE 0.75   Estimated Creatinine Clearance: 81.1 mL/min (by C-G formula based on SCr of 0.75 mg/dL).  Radiologic Imaging: No results found.  I independently reviewed the above imaging studies.  Assessment and Plan LEANDRIA THIER is a 49 y.o. female with  right renal mass here for right robotic assisted laparoscopic radical nephrectomy.     We discussed the risks and benefits of laparoscopic nephrectomy including, but not limited to, infection, bleeding, blood transfusion, possible conversion to open nephrectomy, possible injury to surrounding organs requiring immediate or delayed repair, possible benign path or positive surgical margins, chronic kidney disease with subsequent increased risk of cardiovascular morbidity and mortality, and global anesthesia risks including but not limited to CVA, MI, DVT, PE, pneumonia, and death. The patient expressed understanding and desire to proceed.   Matt R. Marveline Profeta MD 11/15/2020, 7:02 AM  [facility] Pager: [phone]

## 2020-11-16 ENCOUNTER — Other Ambulatory Visit (HOSPITAL_COMMUNITY): Payer: Self-pay | Admitting: Urology

## 2020-11-16 ENCOUNTER — Encounter (HOSPITAL_COMMUNITY): Payer: Self-pay | Admitting: Urology

## 2020-11-16 LAB — BASIC METABOLIC PANEL
Anion gap: 11 (ref 5–15)
BUN: 13 mg/dL (ref 6–20)
CO2: 24 mmol/L (ref 22–32)
Calcium: 9.1 mg/dL (ref 8.9–10.3)
Chloride: 100 mmol/L (ref 98–111)
Creatinine, Ser: 1.01 mg/dL — ABNORMAL HIGH (ref 0.44–1.00)
GFR, Estimated: >60 mL/min (ref >60)
Glucose, Bld: 108 mg/dL — ABNORMAL HIGH (ref 70–99)
Potassium: 3.3 mmol/L — ABNORMAL LOW (ref 3.5–5.1)
Sodium: 135 mmol/L (ref 135–145)

## 2020-11-16 LAB — HEMOGLOBIN AND HEMATOCRIT, BLOOD
HCT: 36.3 % (ref 36.0–46.0)
Hemoglobin: 11.4 g/dL — ABNORMAL LOW (ref 12.0–15.0)

## 2020-11-16 MED ORDER — SODIUM CHLORIDE 0.9% FLUSH
3.0000 mL | Freq: Two times a day (BID) | INTRAVENOUS | Status: DC
  Administered 2020-11-16: 3 mL via INTRAVENOUS

## 2020-11-16 MED ORDER — SODIUM CHLORIDE 0.9 % IV SOLN: 250.0000 mL | INTRAVENOUS | Status: DC | PRN

## 2020-11-16 MED ORDER — ONDANSETRON HCL 4 MG PO TABS: 4.0000 mg | ORAL_TABLET | Freq: Three times a day (TID) | ORAL | 0 refills | Status: DC | PRN

## 2020-11-16 MED ORDER — POTASSIUM CHLORIDE 10 MEQ/100ML IV SOLN
10.0000 meq | INTRAVENOUS | Status: DC
  Administered 2020-11-16 (×2): 10 meq via INTRAVENOUS
  Filled 2020-11-16 (×2): qty 100

## 2020-11-16 MED ORDER — POTASSIUM CHLORIDE CRYS ER 20 MEQ PO TBCR
30.0000 meq | EXTENDED_RELEASE_TABLET | Freq: Once | ORAL | Status: AC
  Administered 2020-11-16: 30 meq via ORAL
  Filled 2020-11-16: qty 1

## 2020-11-16 MED ORDER — SODIUM CHLORIDE 0.9% FLUSH: 3.0000 mL | INTRAVENOUS | Status: DC | PRN

## 2020-11-16 MED FILL — ONDANSETRON HCL 4 MG TABS: 4 | 10 days supply | Qty: 30 | Fill #0

## 2020-11-16 NOTE — Discharge Summary (Signed)
Date of admission: 11/15/2020  Date of discharge: 11/16/2020  Admission diagnosis: Right renal mass  Discharge diagnosis: Right renal mass  Secondary diagnoses: none  History and Physical: For full details, please see admission history and physical. Briefly, [name] is a 49 y.o. year old patient with a right renal mass who underwent a right robotic assisted laparoscopic radical nephrectomy.   Hospital Course: The patient recovered in the usual expected fashion.  She had her diet advanced slowly.  Initially managed with IV pain control, then transitioned to PO meds when she was tolerating oral intake.  Her labs were stable throughout the hospital course.  She was discharged to home on POD#1.  At the time of discharge she was tolerating a regular diet, passing flatus, ambulating, had adequate pain control and was agreeable to discharge.  Follow up as scheduled.    Laboratory values:  Recent Labs    11/15/20 1052 11/16/20 0422  HGB 12.2 11.4*  HCT 39.2 36.3   Recent Labs    11/15/20 1207 11/16/20 0422  CREATININE 0.82 1.01*    Disposition: Home  Discharge instruction: The patient was instructed to be ambulatory but told to refrain from heavy lifting, strenuous activity, or driving.   Discharge medications:  Allergies as of 11/16/2020   No Known Allergies     Medication List    STOP taking these medications   Adult Gummy Chew   ibuprofen 800 MG tablet Commonly known as: ADVIL   naproxen 500 MG tablet Commonly known as: NAPROSYN   OVER THE COUNTER MEDICATION   oxyCODONE-acetaminophen 5-325 MG tablet Commonly known as: Percocet   SUPER B COMPLEX PO   Vitamin C 500 MG Caps     TAKE these medications   docusate sodium 100 MG capsule Commonly known as: Colace Take 1 capsule (100 mg total) by mouth daily as needed.   ezetimibe 10 MG tablet Commonly known as: ZETIA Take 10 mg by mouth daily.   fluticasone 50 MCG/ACT nasal spray Commonly known as:  FLONASE Place 1-2 sprays into both nostrils daily as needed for allergies or rhinitis.   hydrochlorothiazide 12.5 MG tablet Commonly known as: HYDRODIURIL Take 12.5 mg by mouth every evening.   HYDROcodone-acetaminophen 5-325 MG tablet Commonly known as: NORCO/VICODIN Take 1-2 tablets by mouth every 6 (six) hours as needed for moderate pain or severe pain. What changed:   how much to take  reasons to take this Notes to patient: Can have next dose at 3:30   hyoscyamine 0.375 MG 12 hr tablet Commonly known as: LEVBID Take 0.375 mg by mouth at bedtime.   hyoscyamine 0.125 MG SL tablet Commonly known as: LEVSIN SL Take 0.125 mg by mouth every 4 (four) hours as needed for cramping.   loratadine 10 MG tablet Commonly known as: CLARITIN Take 10 mg by mouth daily.   metFORMIN 500 MG 24 hr tablet Commonly known as: GLUCOPHAGE-XR Take 500 mg by mouth at bedtime.   ondansetron 4 MG tablet Commonly known as: Zofran Take 1 tablet (4 mg total) by mouth every 8 (eight) hours as needed for nausea or vomiting. What changed:   when to take this  reasons to take this       Followup:   Follow-up Information    Janith Lima, MD On 11/29/2020.   Specialty: Urology Why: at 9:00 Contact information: [place] [address] [phone]               Cyd Silence.  [facility]  Pager: [phone]

## 2020-11-16 NOTE — Progress Notes (Signed)
RN reviewed discharge paperwork with patient and husband. All questions addressed. IV's removed. Pt left in private vehicle with husband and all belongings including clothes, cell phone, and flowers. VSS. No further needs.

## 2020-11-17 LAB — SURGICAL PATHOLOGY

## 2020-12-13 IMAGING — US US BIOPSY
1 series · 13 of 15 positions shown · non-contrast
Comparison: none

INDICATION: 49-year-old female with hypoenhancing right upper pole renal mass
concerning for papillary renal cell carcinoma. She presents for
ultrasound-guided core biopsy of the same to confirm tissue
diagnosis prior to nephrectomy.

[Series 1: us biopsy (kidney) · 13 of 15 slices shown]
[im 1/15]
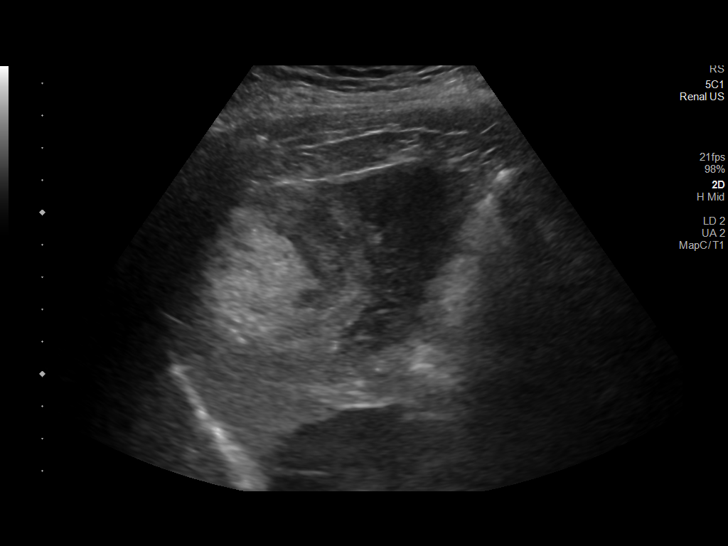
[im 2/15]
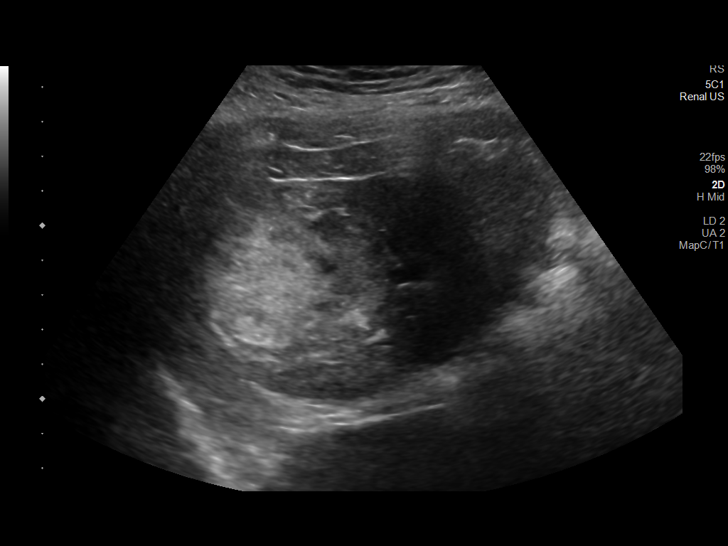
[im 3/15]
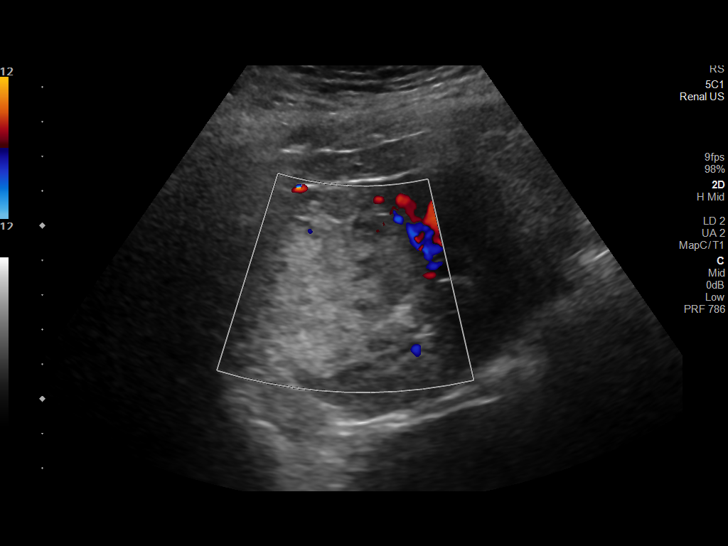
[im 5/15]
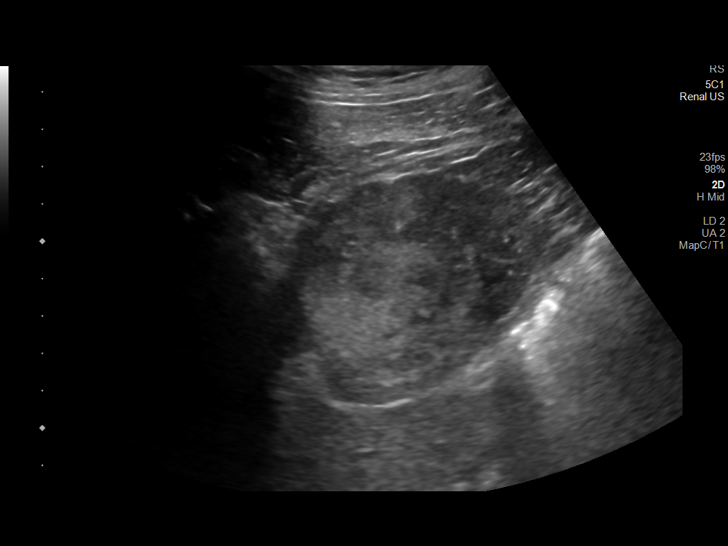
[im 6/15]
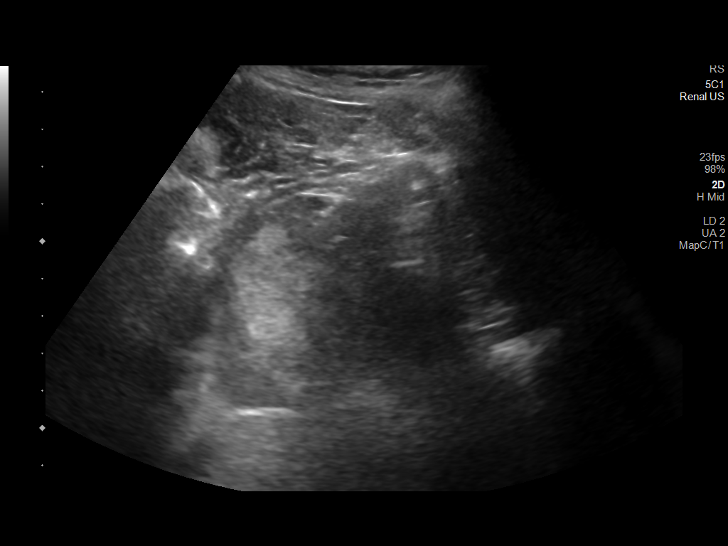
[im 7/15]
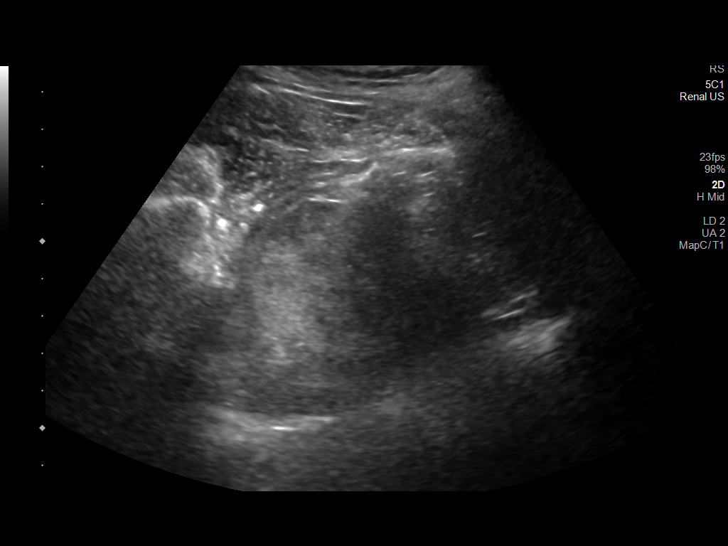
[im 8/15]
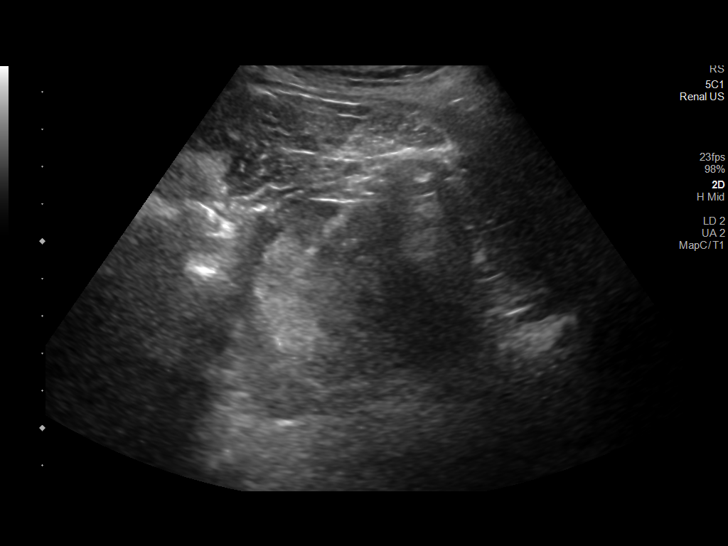
[im 9/15]
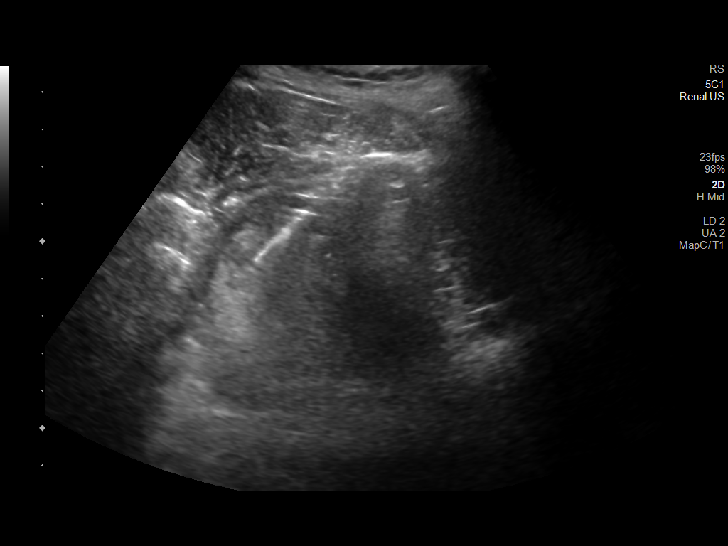
[im 10/15]
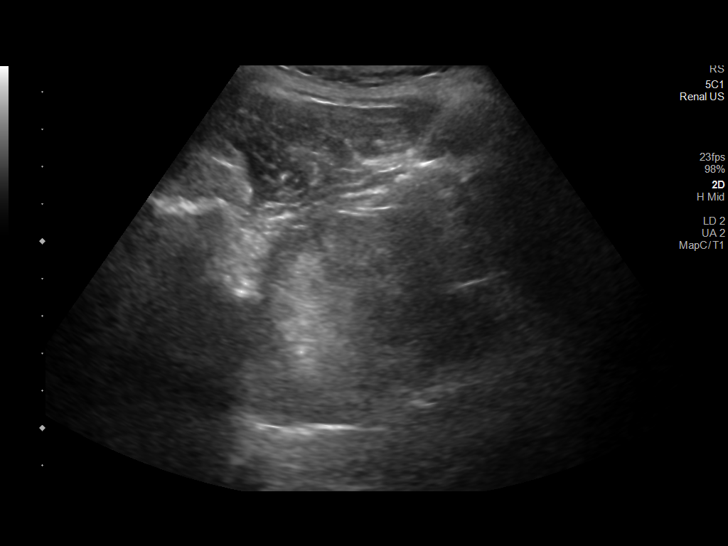
[im 11/15]
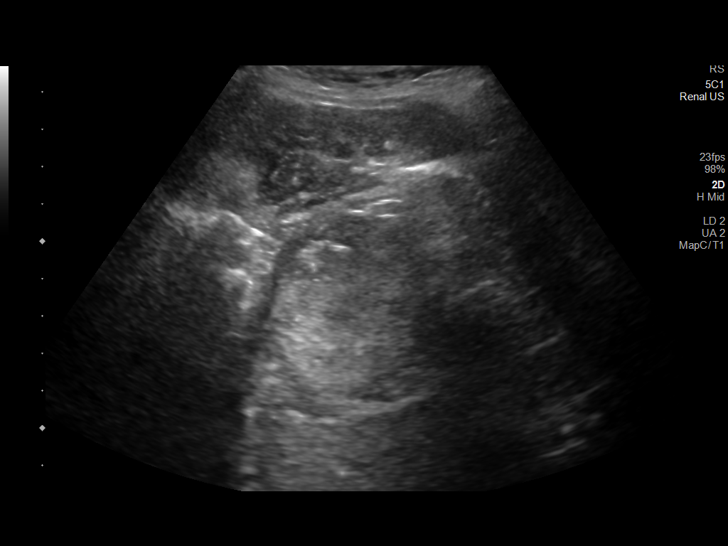
[im 13/15]
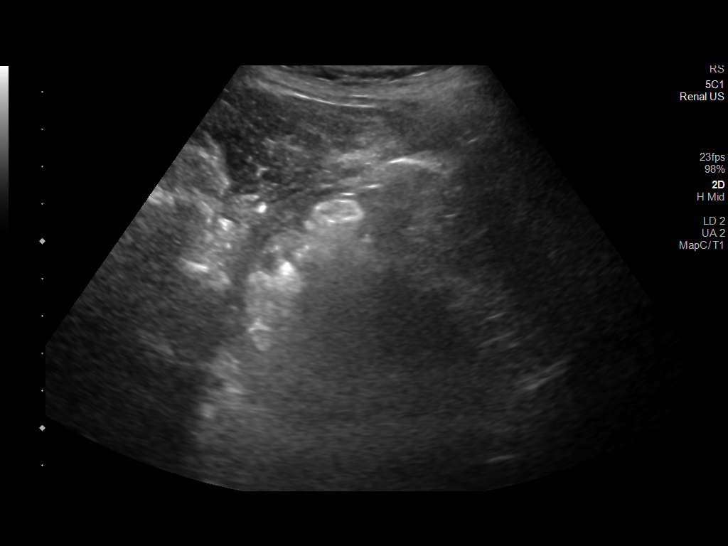
[im 14/15]
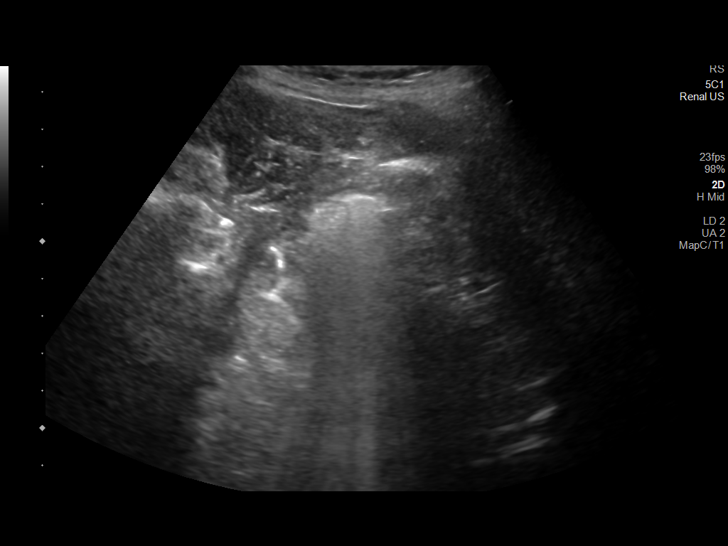
[im 15/15]
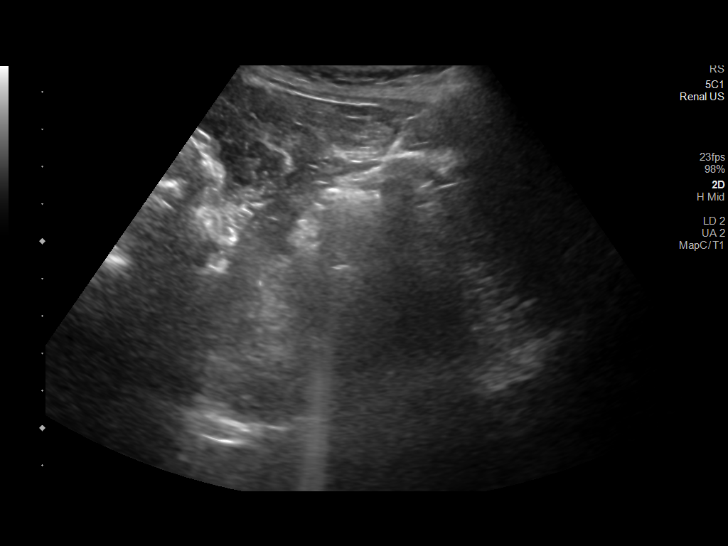

[13 of 15 positions shown; findings below may reference images not displayed]

EXAM:
Ultrasound-guided biopsy of renal mass

MEDICATIONS:
None.

ANESTHESIA/SEDATION:
Moderate (conscious) sedation was employed during this procedure. A
total of Versed 1 mg and Fentanyl 50 mcg was administered
intravenously.

Moderate Sedation Time: 13 minutes. The patient's level of
consciousness and vital signs were monitored continuously by
radiology nursing throughout the procedure under my direct
supervision.

FLUOROSCOPY TIME:  None.

COMPLICATIONS:
None immediate.

PROCEDURE:
Informed written consent was obtained from the patient after a
thorough discussion of the procedural risks, benefits and
alternatives. All questions were addressed. Maximal Sterile Barrier
Technique was utilized including caps, mask, sterile gowns, sterile
gloves, sterile drape, hand hygiene and skin antiseptic. A timeout
was performed prior to the initiation of the procedure.

Ultrasound was used to interrogate the right upper kidney. An
echogenic mass is visible in the upper pole. A suitable skin entry
site was selected and marked. The overlying skin was sterilely
prepped and draped in the standard fashion using chlorhexidine skin
prep. Local anesthesia was attained by infiltration with 1%
lidocaine. A small dermatotomy was made. Under real-time ultrasound
guidance, an 18 gauge trocar needle was carefully advanced into the
upper pole. Multiple 18 gauge core biopsies were then coaxially
obtained using the Elan Whittle automated biopsy device. Biopsy
specimens were placed in formalin and delivered to pathology for
further analysis. As the introducer needle was removed, the biopsy
tract was embolized with a Gel-Foam slurry.

Follow-up ultrasound imaging demonstrates no evidence of immediate
complication. The patient tolerated the procedure well.
IMPRESSION: Ultrasound-guided core biopsy of right upper pole renal mass.

## 2021-04-18 ENCOUNTER — Ambulatory Visit: Payer: 59 | Attending: Internal Medicine

## 2021-04-18 ENCOUNTER — Other Ambulatory Visit (HOSPITAL_BASED_OUTPATIENT_CLINIC_OR_DEPARTMENT_OTHER): Payer: Self-pay

## 2021-04-18 ENCOUNTER — Other Ambulatory Visit: Payer: Self-pay

## 2021-04-18 DIAGNOSIS — Z23 Encounter for immunization: Secondary | ICD-10-CM

## 2021-04-18 MED ORDER — COVID-19 MRNA VAC-TRIS(PFIZER) 30 MCG/0.3ML IM SUSP
INTRAMUSCULAR | 0 refills | Status: DC
  Filled 2021-04-18: qty 0.3, 1d supply, fill #0

## 2021-04-18 NOTE — Progress Notes (Signed)
   Covid-19 Vaccination Clinic  Name:  [name]    MRN: [id number] DOB: Sep 26, 1971  04/18/2021  [name] was observed post Covid-19 immunization for 15 minutes without incident. She was provided with Vaccine Information Sheet and instruction to access the V-Safe system.   [name] was instructed to call 911 with any severe reactions post vaccine: Marland Kitchen Difficulty breathing  . Swelling of face and throat  . A fast heartbeat  . A bad rash all over body  . Dizziness and weakness   Immunizations Administered    Name Date Dose VIS Date Route   PFIZER Comrnaty(Gray TOP) Covid-19 Vaccine 04/18/2021 10:49 AM 0.3 mL 12/02/2020 Intramuscular   Manufacturer: [employer], [employer]   Lot: [id number]   NDC: [phone]

## 2021-10-24 NOTE — Progress Notes (Signed)
 [place] [address] [facility] - [address] [place] [id number] Dept: [phone] Clinical Visit Note     CHIEF COMPLAINT Patient presents for Eye Exam (Patient is here for a yearly eye exam. She states her vision is stable and has no discomfort. History of Lasik OU)   HISTORY OF PRESENT ILLNESS: [name] is a 50 y.o. old female who presents to the clinic today for:   HPI    Eye Exam    I, the attending physician, performed the HPI with the patient and updated the documentation appropriately.  The patient reports symptoms in both eyes.  The patient requests a new glasses prescription.  Review of eye symptoms reveals: Negative for flashes, floaters and eye pain. Additional comments: Patient is here for a yearly eye exam. She states her vision is stable and has no discomfort. History of Lasik OU       Last edited by Randine Donald Hock, COT on 10/24/2021  2:20 PM. (History)      HISTORICAL INFORMATION:    CURRENT MEDICATIONS:  Current Outpatient Medications:  .  ezetimibe  (ZETIA ) 10 mg tablet,  , Disp: , Rfl:  .  fluticasone  propionate (FLONASE ) 50 mcg/actuation nasal spray, 2 sprays by Nasal route., Disp: , Rfl:  .  hydroCHLOROthiazide  (HYDRODIURIL ) 12.5 MG tablet, Take by mouth., Disp: , Rfl:  .  loratadine  (CLARITIN ) 10 mg tablet, Take 10 mg by mouth., Disp: , Rfl:  .  metFORMIN  ER (GLUCOPHAGE -XR) 500 MG extended release tablet, Take by mouth., Disp: , Rfl:  .  hyoscyamine  (LEVBID ) 0.375 mg 12 hr tablet, TAKE 1 TABLET (0.375 MG TOTAL) BY MOUTH NIGHTLY FOR 30 DAYS., Disp: 90 tablet, Rfl: 0 .  hyoscyamine  (LEVSIN /SL) 0.125 mg SL tablet, TAKE 1 TABLET (0.125 MG TOTAL) BY MOUTH EVERY 4 (FOUR) HOURS AS NEEDED FOR UP TO 30 DAYS FOR CRAMPING., Disp: 540 tablet, Rfl: 1   Referring physician: A Referral Self No address on file  REVIEW OF SYSTEMS: No notes on file  ALLERGIES No Known Allergies  PAST MEDICAL  HISTORY Past Medical History:  Diagnosis Date  . Family history of colon cancer in father   . H/O seasonal allergies   . History of adenomatous polyp of colon   . Internal hemorrhoids   . Kidney cancer, primary, with metastasis from kidney to other site [facility])   . PCOD (polycystic ovarian disease)   . S/P LASIK surgery of both eyes 2011   Past Surgical History:  Procedure Laterality Date  . CERVICAL CERCLAGE    . CHOLECYSTECTOMY     2018  . COLONOSCOPY     2006, 2011, 2016, 2021  . NASAL SINUS SURGERY    . NEPHRECTOMY Right 11/20/2020  . OVARIAN CYST REMOVAL    . REFRACTIVE SURGERY Right    2011  . UTERINE ARTERY EMBOLIZATION      FAMILY HISTORY Family History  Problem Relation Age of Onset  . Cataracts Mother   . Colon cancer Father 77  . Diabetes Father   . Diabetes Maternal Grandfather   . Cancer Paternal Grandmother   . Glaucoma Neg Hx   . Macular degeneration Neg Hx   . Stroke Neg Hx   . Retinal detachment Neg Hx   . Strabismus Neg Hx     SOCIAL HISTORY Social History   Tobacco Use  . Smoking status: Never Smoker  . Smokeless tobacco: Never Used  Substance Use Topics  .  Alcohol use: Yes      OPHTHALMIC EXAM: Base Eye Exam    Visual Acuity (Snellen - Linear)      Right Left   Dist cc 20/25 20/20 -1   Near cc J1+ J1   Correction: Glasses       Tonometry (Applanation, 2:32 PM)      Right Left   Pressure 11 11       Pupils      Pupils   Right PERRL   Left PERRL       Visual Fields      Left Right    Full Full       Extraocular Movement      Right Left    Full, Ortho Full, Ortho       Neuro/Psych    Oriented x3: Yes   Mood/Affect: Normal       Dilation    Both eyes: 1.0% Tropicamide (Mydriacyl) @ 2:32 PM        Slit Lamp and Fundus Exam    External Exam      Right Left   External Normal Normal       Slit Lamp Exam      Right Left   Lids/Lashes Normal Normal   Conjunctiva/Sclera White and quiet White and quiet    Cornea LASIK scar LASIK scar   Anterior Chamber Deep and quiet Deep and quiet   Iris Round and reactive Round and reactive   Lens Clear Clear       Fundus Exam      Right Left   Vitreous Normal Normal   Disc Normal Normal   C/D Ratio 0.2 0.2   Macula Normal Normal   Vessels Normal Normal   Periphery Normal Normal        Refraction    Wearing Rx      Sphere Cylinder Axis Add   Right -0.75 +1.00 160 +2.00   Left -0.25 Sphere  +2.00       Manifest Refraction      Sphere Cylinder Axis Dist VA Add Near [place]   Right -0.75 +1.00 160 20/25 +2.00 J1+   Left +0.25 Sphere  20/20 +2.00 J1+  Patient prefers +0.25 sphere OS better and +2.00 add (vs +2.25)       Final Rx      Sphere Cylinder Axis Dist VA Add Near [place]   Right -0.75 +1.00 160 20/25 +2.00 J1+   Left +0.25 Sphere  20/20 +2.00 J1+   Expiration Date: 10/25/2022   Comments: +/- sphere is correct          IMAGING AND PROCEDURES:      ASSESSMENT/PLAN:  1. Presbyopia      Ophthalmic Meds Ordered this visit:  There were no meds ordered this visit.    Return for 1 year CE Routine.  There are no Patient Instructions on file for this visit.    Copy of glasses RX given to patient. S/P LASIK OU, flaps clear.  Explained the diagnoses, plan, and follow up with the patient and they expressed understanding.  Patient expressed understanding of the importance of proper follow up care.    Abbreviations: M myopia (nearsighted); A astigmatism; H hyperopia (farsighted); P presbyopia; Mrx spectacle prescription;  CTL contact lenses; OD right eye; OS left eye; OU both eyes  XT exotropia; ET esotropia; PEK punctate epithelial keratitis; PEE punctate epithelial erosions; DES dry eye syndrome; MGD meibomian gland dysfunction; ATs artificial tears; PFAT's preservative free artificial tears;  NSC nuclear sclerotic cataract; PSC posterior subcapsular cataract; ERM epi-retinal membrane; PVD posterior vitreous detachment; RD  retinal detachment; DM diabetes mellitus; DR diabetic retinopathy; NPDR non-proliferative diabetic retinopathy; PDR proliferative diabetic retinopathy; CSME clinically significant macular edema; DME diabetic macular edema; dbh dot blot hemorrhages; CWS cotton wool spot; POAG primary open angle glaucoma; C/D cup-to-disc ratio; HVF humphrey visual field; GVF goldmann visual field; OCT optical coherence tomography; IOP intraocular pressure; BRVO Branch retinal vein occlusion; CRVO central retinal vein occlusion; CRAO central retinal artery occlusion; BRAO branch retinal artery occlusion; RT retinal tear; SB scleral buckle; PPV pars plana vitrectomy; VH Vitreous hemorrhage; PRP panretinal laser photocoagulation; IVK intravitreal kenalog; VMT vitreomacular traction; MH Macular hole;  NVD neovascularization of the disc; NVE neovascularization elsewhere; AREDS age related eye disease study; ARMD age related macular degeneration; POAG primary open angle glaucoma; EBMD epithelial/anterior basement membrane dystrophy; ACIOL anterior chamber intraocular lens; IOL intraocular lens; PCIOL posterior chamber intraocular lens; Phaco/IOL phacoemulsification with intraocular lens placement; PRK photorefractive keratectomy; LASIK laser assisted in situ keratomileusis; HTN hypertension; DM diabetes mellitus; COPD chronic obstructive pulmonary disease   This document serves as a record of services personally performed by Oneil ONEIDA Platts, MD.  It was created on their behalf by Gustav Stephane Acre, COA, a trained medical scribe, and Certified Ophthalmic Assistant (COA). During the course of documenting the history, physical exam and medical decision making, I was functioning as a Stage manager. The creation of this record is the provider's dictation and/or activities during the visit.  Electronically signed by Gustav Stephane Acre, COA 10/24/2021 3:00 PM I agree the documentation is accurate and complete.  Electronically signed by:  Oneil Debby Platts, MD 10/24/2021 3:09 PM      Electronically signed by: Oneil Debby Platts, MD 10/24/21 [phone]

## 2021-10-25 ENCOUNTER — Other Ambulatory Visit: Payer: Self-pay

## 2021-10-25 ENCOUNTER — Other Ambulatory Visit (HOSPITAL_COMMUNITY): Payer: Self-pay | Admitting: Urology

## 2021-10-25 ENCOUNTER — Ambulatory Visit (HOSPITAL_COMMUNITY)
Admission: RE | Admit: 2021-10-25 | Discharge: 2021-10-25 | Disposition: A | Discharge status: Home / Self Care | Payer: 59 | Source: Ambulatory Visit | Attending: Urology | Admitting: Urology

## 2021-10-25 DIAGNOSIS — D49511 Neoplasm of unspecified behavior of right kidney: Secondary | ICD-10-CM | POA: Diagnosis not present

## 2022-06-22 ENCOUNTER — Ambulatory Visit (INDEPENDENT_AMBULATORY_CARE_PROVIDER_SITE_OTHER): Payer: 59 | Admitting: Family

## 2022-06-22 ENCOUNTER — Encounter (HOSPITAL_BASED_OUTPATIENT_CLINIC_OR_DEPARTMENT_OTHER): Payer: Self-pay | Admitting: Family

## 2022-06-22 VITALS — BP 146/100 | HR 58 | Ht 64.0 in | Wt 162.0 lb

## 2022-06-22 DIAGNOSIS — I10 Essential (primary) hypertension: Secondary | ICD-10-CM | POA: Diagnosis not present

## 2022-06-22 DIAGNOSIS — E782 Mixed hyperlipidemia: Secondary | ICD-10-CM | POA: Diagnosis not present

## 2022-06-22 MED ORDER — OLMESARTAN MEDOXOMIL-HCTZ 20-12.5 MG PO TABS: 1.0000 | ORAL_TABLET | Freq: Every day | ORAL | 2 refills | Status: DC

## 2022-06-22 NOTE — Patient Instructions (Signed)
Medication Instructions:  Your physician has recommended you make the following change in your medication:    Stop: Rosuvastatin- We will send you a message in a couple weeks to see how the muscle cramps are doing   Stop: Hydrochlorothiazide  Start: Olmesartan-Hydrochlorothiazide (Benicar HCT) 20-12.'5mg'$  daily     Labwork: Your physician recommends that you return for lab work today- BMP, TSH, Lipoprotein A    Testing/Procedures: EKG looked great today!     Follow-Up: Please follow up in 1 month with the PharmD team at the [facility] office with [facility] July 25th at 8am. Please bring your blood pressure cuff and log with you.      Special Instructions:  DASH Eating Plan DASH stands for Dietary Approaches to Stop Hypertension. The DASH eating plan is a healthy eating plan that has been shown to: Reduce high blood pressure (hypertension). Reduce your risk for type 2 diabetes, heart disease, and stroke. Help with weight loss. What are tips for following this plan? Reading food labels Check food labels for the amount of salt (sodium) per serving. Choose foods with less than 5 percent of the Daily Value of sodium. Generally, foods with less than 300 milligrams (mg) of sodium per serving fit into this eating plan. To find whole grains, look for the word "whole" as the first word in the ingredient list. Shopping Buy products labeled as "low-sodium" or "no salt added." Buy fresh foods. Avoid canned foods and pre-made or frozen meals. Cooking Avoid adding salt when cooking. Use salt-free seasonings or herbs instead of table salt or sea salt. Check with your health care provider or pharmacist before using salt substitutes. Do not fry foods. Cook foods using healthy methods such as baking, boiling, grilling, roasting, and broiling instead. Cook with heart-healthy oils, such as olive, canola, avocado, soybean, or sunflower oil. Meal planning  Eat a balanced diet that includes: 4 or  more servings of fruits and 4 or more servings of vegetables each day. Try to fill one-half of your plate with fruits and vegetables. 6-8 servings of whole grains each day. Less than 6 oz (170 g) of lean meat, poultry, or fish each day. A 3-oz (85-g) serving of meat is about the same size as a deck of cards. One egg equals 1 oz (28 g). 2-3 servings of low-fat dairy each day. One serving is 1 cup (237 mL). 1 serving of nuts, seeds, or beans 5 times each week. 2-3 servings of heart-healthy fats. Healthy fats called omega-3 fatty acids are found in foods such as walnuts, flaxseeds, fortified milks, and eggs. These fats are also found in cold-water fish, such as sardines, salmon, and mackerel. Limit how much you eat of: Canned or prepackaged foods. Food that is high in trans fat, such as some fried foods. Food that is high in saturated fat, such as fatty meat. Desserts and other sweets, sugary drinks, and other foods with added sugar. Full-fat dairy products. Do not salt foods before eating. Do not eat more than 4 egg yolks a week. Try to eat at least 2 vegetarian meals a week. Eat more home-cooked food and less restaurant, buffet, and fast food. Lifestyle When eating at a restaurant, ask that your food be prepared with less salt or no salt, if possible. If you drink alcohol: Limit how much you use to: 0-1 drink a day for women who are not pregnant. 0-2 drinks a day for men. Be aware of how much alcohol is in your drink. In the  [place] one drink equals one 12 oz bottle of beer (355 mL), one 5 oz glass of wine (148 mL), or one 1 oz glass of hard liquor (44 mL). General information Avoid eating more than 2,300 mg of salt a day. If you have hypertension, you may need to reduce your sodium intake to 1,500 mg a day. Work with your health care provider to maintain a healthy body weight or to lose weight. Ask what an ideal weight is for you. Get at least 30 minutes of exercise that causes your heart to  beat faster (aerobic exercise) most days of the week. Activities may include walking, swimming, or biking. Work with your health care provider or dietitian to adjust your eating plan to your individual calorie needs. What foods should I eat? Fruits All fresh, dried, or frozen fruit. Canned fruit in natural juice (without added sugar). Vegetables Fresh or frozen vegetables (raw, steamed, roasted, or grilled). Low-sodium or reduced-sodium tomato and vegetable juice. Low-sodium or reduced-sodium tomato sauce and tomato paste. Low-sodium or reduced-sodium canned vegetables. Grains Whole-grain or whole-wheat bread. Whole-grain or whole-wheat pasta. Brown rice. [place]. Bulgur. Whole-grain and low-sodium cereals. Pita bread. Low-fat, low-sodium crackers. Whole-wheat flour tortillas. Meats and other proteins Skinless chicken or [place]. Ground chicken or [place]. Pork with fat trimmed off. Fish and seafood. Egg whites. Dried beans, peas, or lentils. Unsalted nuts, nut butters, and seeds. Unsalted canned beans. Lean cuts of beef with fat trimmed off. Low-sodium, lean precooked or cured meat, such as sausages or meat loaves. Dairy Low-fat (1%) or fat-free (skim) milk. Reduced-fat, low-fat, or fat-free cheeses. Nonfat, low-sodium ricotta or cottage cheese. Low-fat or nonfat yogurt. Low-fat, low-sodium cheese. Fats and oils Soft margarine without trans fats. Vegetable oil. Reduced-fat, low-fat, or light mayonnaise and salad dressings (reduced-sodium). Canola, safflower, olive, avocado, soybean, and sunflower oils. Avocado. Seasonings and condiments Herbs. Spices. Seasoning mixes without salt. Other foods Unsalted popcorn and pretzels. Fat-free sweets. The items listed above may not be a complete list of foods and beverages you can eat. Contact a dietitian for more information. What foods should I avoid? Fruits Canned fruit in a light or heavy syrup. Fried fruit. Fruit in cream or butter  sauce. Vegetables Creamed or fried vegetables. Vegetables in a cheese sauce. Regular canned vegetables (not low-sodium or reduced-sodium). Regular canned tomato sauce and paste (not low-sodium or reduced-sodium). Regular tomato and vegetable juice (not low-sodium or reduced-sodium). Angie Fava. Olives. Grains Baked goods made with fat, such as croissants, muffins, or some breads. Dry pasta or rice meal packs. Meats and other proteins Fatty cuts of meat. Ribs. Fried meat. Berniece Salines. Bologna, salami, and other precooked or cured meats, such as sausages or meat loaves. Fat from the back of a pig (fatback). Bratwurst. Salted nuts and seeds. Canned beans with added salt. Canned or smoked fish. Whole eggs or egg yolks. Chicken or [place] with skin. Dairy Whole or 2% milk, cream, and half-and-half. Whole or full-fat cream cheese. Whole-fat or sweetened yogurt. Full-fat cheese. Nondairy creamers. Whipped toppings. Processed cheese and cheese spreads. Fats and oils Butter. Stick margarine. Lard. Shortening. Ghee. Bacon fat. Tropical oils, such as coconut, palm kernel, or palm oil. Seasonings and condiments Onion salt, garlic salt, seasoned salt, table salt, and sea salt. Worcestershire sauce. Tartar sauce. Barbecue sauce. Teriyaki sauce. Soy sauce, including reduced-sodium. Steak sauce. Canned and packaged gravies. Fish sauce. Oyster sauce. Cocktail sauce. Store-bought horseradish. Ketchup. Mustard. Meat flavorings and tenderizers. Bouillon cubes. Hot sauces. Pre-made or packaged marinades. Pre-made or packaged taco  seasonings. Relishes. Regular salad dressings. Other foods Salted popcorn and pretzels. The items listed above may not be a complete list of foods and beverages you should avoid. Contact a dietitian for more information. Where to find more information National Heart, Lung, and Blood Institute: https://Kenson Groh-eaton.com/ American Heart Association: www.heart.org Academy of Nutrition and Dietetics:  www.eatright.[place]: www.kidney.org Summary The DASH eating plan is a healthy eating plan that has been shown to reduce high blood pressure (hypertension). It may also reduce your risk for type 2 diabetes, heart disease, and stroke. When on the DASH eating plan, aim to eat more fresh fruits and vegetables, whole grains, lean proteins, low-fat dairy, and heart-healthy fats. With the DASH eating plan, you should limit salt (sodium) intake to 2,300 mg a day. If you have hypertension, you may need to reduce your sodium intake to 1,500 mg a day. Work with your health care provider or dietitian to adjust your eating plan to your individual calorie needs. This information is not intended to replace advice given to you by your health care provider. Make sure you discuss any questions you have with your health care provider. Document Revised: 11/14/2019 Document Reviewed: 11/14/2019 Elsevier Patient Education  [place].

## 2022-06-22 NOTE — Progress Notes (Unsigned)
Advanced Hypertension Clinic Initial Assessment:    Date:  06/24/2022   ID:  [name], DOB [birth year], MRN [id number]  PCP:  Kelton Pillar, MD  Cardiologist:  None  Nephrologist:  Referring MD: Servando Salina, MD   CC: Hypertension  History of Present Illness:    [name] is a 51 y.o. female with a hx of hypertension, allergic rhinitis, hyperlipidemia, prediabetes here to establish care in the Advanced Hypertension Clinic.   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 She has an adult son and a daughter who is entering her senior year of high school.    [name] was diagnosed with hypertension ten years ago around age 20. Has had high cholesterol "all her life" as high as 60 at 51 years old. Does note previous difficulty with Atorvastatin with cognitive difficulties.  Blood pressure not checked routinely at home.  she reports tobacco use never. Alcohol use only 2-4 times per month. For exercise she works out daily [employer] bike daily and a treadmill - 30 minutes daily. Strength trains 3-4 times per week. she eats outside of the home most of the time - has avoided beef and pork for last 2 years and cut out chicken 2 weeks aog. Follows a mostly plant based diet. She does follow low sodium diet overall but notes some indiscretion with french fries.  Reports no shortness of breath nor dyspnea on exertion. Reports no chest pain, pressure, or tightness. No edema, orthopnea, PND. Reports no palpitations.     Previous antihypertensives: None   Past Medical History:  Diagnosis Date   Allergy    takes Claritin and uses Flonase daily   Anemia    Cancer ([place])    kidney   Chronic kidney disease    History of bronchitis as a child    Hyperlipidemia    not on any meds   Hypertension    takes HCTZ daily   PCOS (polycystic ovarian syndrome)    Pre-diabetes     Past Surgical History:  Procedure Laterality Date   CERVICAL CERCLAGE      CHOLECYSTECTOMY N/A 04/13/2017   Procedure: LAPAROSCOPIC CHOLECYSTECTOMY;  Surgeon: Clovis Riley, MD;  Location: [facility];  Service: General;  Laterality: N/A;   COLONOSCOPY     cyst removed from ovary     [place] Right 11/15/2020   Procedure: XI ROBOTIC National;  Surgeon: Janith Lima, MD;  Location: [facility];  Service: Urology;  Laterality: Right;   ROBOTIC ASSISTED LAPAROSCOPIC HYSTERECTOMY AND SALPINGECTOMY Bilateral 08/28/2019   Procedure: XI ROBOTIC ASSISTED LAPAROSCOPIC HYSTERECTOMY AND SALPINGECTOMY;  Surgeon: Servando Salina, MD;  Location: [facility];  Service: Gynecology;  Laterality: Bilateral;  Gaylord Shih RNFA   UTERINE ARTERY EMBOLIZATION     wisdom teeth extracted      Current Medications: Current Meds  Medication Sig   COVID-19 mRNA Vac-TriS, Pfizer, SUSP injection Inject into the muscle.   estradiol (ESTRACE) 0.1 MG/GM vaginal cream daily.   ezetimibe (ZETIA) 10 MG tablet Take 10 mg by mouth daily.    fexofenadine (ALLEGRA ALLERGY) 180 MG tablet Take 180 mg by mouth daily.   metFORMIN (GLUCOPHAGE-XR) 500 MG 24 hr tablet Take 500 mg by mouth at bedtime.    olmesartan-hydrochlorothiazide (BENICAR HCT) 20-12.5 MG tablet  Take 1 tablet by mouth daily.   [DISCONTINUED] hydrochlorothiazide (HYDRODIURIL) 12.5 MG tablet Take 12.5 mg by mouth every evening.    [DISCONTINUED] rosuvastatin (CRESTOR) 5 MG tablet Take 5 mg by mouth every other day.     Allergies:   Patient has no known allergies.   Social History   Socioeconomic History   Marital status: Married    Spouse name: Not on file   Number of children: Not on file   Years of education: Not on file   Highest education level: Not on file  Occupational History   Not on file  Tobacco Use   Smoking status: Never   Smokeless tobacco: Never  Vaping  Use   Vaping Use: Never used  Substance and Sexual Activity   Alcohol use: Yes    Comment: social   Drug use: No   Sexual activity: Not on file  Other Topics Concern   Not on file  Social History Narrative   Not on file   Social Determinants of Health   Financial Resource Strain: Low Risk  (06/22/2022)   Overall Financial Resource Strain (CARDIA)    Difficulty of Paying Living Expenses: Not hard at all  Food Insecurity: No Food Insecurity (06/22/2022)   Hunger Vital Sign    Worried About Running Out of Food in the Last Year: Never true    [place] in the Last Year: Never true  Transportation Needs: No Transportation Needs (06/22/2022)   PRAPARE - Hydrologist (Medical): No    Lack of Transportation (Non-Medical): No  Physical Activity: Not on file  Stress: No Stress Concern Present (06/22/2022)   [place]    Feeling of Stress : Not at all  Social Connections: Socially Integrated (06/22/2022)   Social Connection and Isolation Panel [NHANES]    Frequency of Communication with Friends and Family: More than three times a week    Frequency of Social Gatherings with Friends and Family: Once a week    Attends Religious Services: More than 4 times per year    Active Member of [employer] or Organizations: Yes    Attends Music therapist: More than 4 times per year    Marital Status: Married     Family History: The patient's family history includes Cancer in her father and another family member; Diabetes in her father; Heart disease in her father; Hypertension in her father; Sleep apnea in an other family member.  ROS:   Please see the history of present illness.     All other systems reviewed and are negative.  EKGs/Labs/Other Studies Reviewed:    EKG:  EKG is ordered today.  The ekg ordered today demonstrates SB 58 bpm with no acute ST/ T wave changes.   Recent  Labs: 06/22/2022: BUN 14; Creatinine, Ser 0.92; Potassium 4.6; Sodium 138; TSH 1.760   Recent Lipid Panel No results found for: "CHOL", "TRIG", "HDL", "CHOLHDL", "VLDL", "LDLCALC", "LDLDIRECT"  Physical Exam:   VS:  BP (!) 146/100   Pulse (!) 58   Ht '5\' 4"'$  (1.626 m)   Wt 162 lb (73.5 kg)   LMP 08/20/2019 Comment: camila  BMI 27.81 kg/m  , BMI Body mass index is 27.81 kg/m. GENERAL:  Well appearing HEENT: Pupils equal round and reactive, fundi not visualized, oral mucosa unremarkable NECK:  No jugular venous distention, waveform within normal limits, carotid upstroke brisk and symmetric, no bruits, no thyromegaly LYMPHATICS:  No cervical adenopathy LUNGS:  Clear to auscultation bilaterally HEART:  RRR.  PMI not displaced or sustained,S1 and S2 within normal limits, no S3, no S4, no clicks, no rubs,  murmurs ABD:  Flat, positive bowel sounds normal in frequency in pitch, no bruits, no rebound, no guarding, no midline pulsatile mass, no hepatomegaly, no splenomegaly EXT:  2 plus pulses throughout, no edema, no cyanosis no clubbing SKIN:  No rashes no nodules NEURO:  Cranial nerves II through XII grossly intact, motor grossly intact throughout PSYCH:  Cognitively intact, oriented to person place and time   ASSESSMENT/PLAN:    HTN - BP not at goal <130/80. Has been on HCTZ alone. Enrolled in research study today. Stop HCTZ. Instead, start Olmesartan-HCTZ 20-12.'5mg'$  QD. BMP, TSH today. Will not enroll in PREP as she has a regularly exercise routine. Provided handout on low salt diet - she eats mostly plant based.   HLD -notes muscle, joint discomfort with rosuvastatin. Prior intolerance to Atorvastatin. Trial 2-week statin holiday.  Continue Zetia '10mg'$  QD. Most recent lipid panel 02/2022 total cholesterol 246, HDL 61, LDL 155, triglycerides 170.  Notes her cholesterol has never been well controlled.  Lipoprotein a to assess for familial hyperlipidemia.  Continue ASCVD risk score 6.5%.  Per ACC   guidelines consider moderate intensity statin. May need CT ot assess for coronary or aortic atherosclerosis to determine how aggressive lipid control will need to be or candidacy for PCSK9i.  Screening for Secondary Hypertension:     06/23/2022    4:00 PM  Causes  Drugs/Herbals Screened  Thyroid Disease Screened  Coarctation of the Aorta Screened     - Comments BP symmetrical  Compliance Screened    Relevant Labs/Studies:    Latest Ref Rng & Units 06/22/2022   10:32 AM 11/16/2020    4:22 AM 11/15/2020   12:07 PM  Basic Labs  Sodium 134 - 144 mmol/L 138  135    Potassium 3.5 - 5.2 mmol/L 4.6  3.3    Creatinine 0.57 - 1.00 mg/dL 0.92  1.01  0.82        Latest Ref Rng & Units 06/22/2022   10:32 AM  Thyroid   TSH 0.450 - 4.500 uIU/mL 1.760                  she consents to be monitored in our remote patient monitoring program through [facility].  she will track his blood pressure twice daily and understands that these trends will help [place] to adjust her medications as needed prior to his next appointment   Disposition:    FU with PharmD in 1 month    Medication Adjustments/Labs and Tests Ordered: Current medicines are reviewed at length with the patient today.  Concerns regarding medicines are outlined above.  Orders Placed This Encounter  Procedures   Basic metabolic panel   TSH   Lipoprotein A (LPA)   EKG 12-Lead   Meds ordered this encounter  Medications   olmesartan-hydrochlorothiazide (BENICAR HCT) 20-12.5 MG tablet    Sig: Take 1 tablet by mouth daily.    Dispense:  30 tablet    Refill:  2     Signed, Loel Dubonnet, NP  06/24/2022 4:27 PM    [facility]

## 2022-06-23 LAB — BASIC METABOLIC PANEL
BUN/Creatinine Ratio: 15 (ref 9–23)
BUN: 14 mg/dL (ref 6–24)
CO2: 27 mmol/L (ref 20–29)
Calcium: 10.1 mg/dL (ref 8.7–10.2)
Chloride: 99 mmol/L (ref 96–106)
Creatinine, Ser: 0.92 mg/dL (ref 0.57–1.00)
Glucose: 75 mg/dL (ref 70–99)
Potassium: 4.6 mmol/L (ref 3.5–5.2)
Sodium: 138 mmol/L (ref 134–144)
eGFR: 75 mL/min/{1.73_m2} (ref >59)

## 2022-06-23 LAB — LIPOPROTEIN A (LPA): Lipoprotein (a): 222.3 nmol/L — ABNORMAL HIGH (ref <75.0)

## 2022-06-23 LAB — TSH: TSH: 1.76 u[IU]/mL (ref 0.450–4.500)

## 2022-06-24 ENCOUNTER — Encounter (HOSPITAL_BASED_OUTPATIENT_CLINIC_OR_DEPARTMENT_OTHER): Payer: Self-pay | Admitting: Family

## 2022-06-26 ENCOUNTER — Telehealth (HOSPITAL_BASED_OUTPATIENT_CLINIC_OR_DEPARTMENT_OTHER): Payer: Self-pay

## 2022-06-26 DIAGNOSIS — E782 Mixed hyperlipidemia: Secondary | ICD-10-CM

## 2022-06-26 NOTE — Telephone Encounter (Addendum)
Results called to patient who verbalizes understanding!    ----- Message from Loel Dubonnet, NP sent at 06/23/2022  5:12 PM EDT ----- Normal kidney function, electrolytes, thyroid.  Lipoprotein a elevated suggestive of a genetic component to her high cholesterol.  Would recommend coronary calcium score-this is a $99 out-of-pocket CT scan that assesses for any plaque buildup in the heart that would indicate we need to be more aggressive about lowering her cholesterol.

## 2022-06-29 ENCOUNTER — Ambulatory Visit (HOSPITAL_BASED_OUTPATIENT_CLINIC_OR_DEPARTMENT_OTHER)
Admission: RE | Admit: 2022-06-29 | Discharge: 2022-06-29 | Disposition: A | Discharge status: Home / Self Care | Payer: 59 | Source: Ambulatory Visit | Attending: Family | Admitting: Family

## 2022-06-29 DIAGNOSIS — E782 Mixed hyperlipidemia: Secondary | ICD-10-CM | POA: Insufficient documentation

## 2022-07-18 ENCOUNTER — Encounter: Payer: Self-pay | Admitting: Pharmacist Clinician (PhC)/ Clinical Pharmacy Specialist

## 2022-07-18 ENCOUNTER — Ambulatory Visit: Payer: 59 | Admitting: Pharmacist Clinician (PhC)/ Clinical Pharmacy Specialist

## 2022-07-18 VITALS — BP 157/94 | Ht 64.0 in | Wt 160.0 lb

## 2022-07-18 DIAGNOSIS — E782 Mixed hyperlipidemia: Secondary | ICD-10-CM

## 2022-07-18 DIAGNOSIS — I10 Essential (primary) hypertension: Secondary | ICD-10-CM | POA: Diagnosis not present

## 2022-07-18 MED ORDER — OLMESARTAN MEDOXOMIL-HCTZ 40-25 MG PO TABS
1.0000 | ORAL_TABLET | Freq: Every day | ORAL | 6 refills | Status: DC
Start: 2022-07-18 — End: 2023-01-29

## 2022-07-18 NOTE — Progress Notes (Signed)
07/18/2022 [name] 1971/10/01 [id number]   HPI:  [name] is a 51 y.o. female patient of Dr Oval Linsey, who presents today for hypertension clinic evaluation.  Patient was referred to the advanced hypertension clinic by Dr. Garwin Brothers.  In addition to hypertension, her medical history is significant for hyperlipidemia (LDL 155 - 3./23) and pre-diabetes (A1c 5.8 -3/23).  Labs showed her to have an Lp(a) of 223, however a follow up coronary calcium scan showed a score of 0.  Per patient, she has had problems with hypertension for the past 10 years, being first diagnosed around age 56.   She was seen by Laurann Montana last month and her HCTZ was switched to olmesartan-hctz (20/12.5 mg) combination tablet.   She was agreeable to join our [place] and was randomized to group 1  Today she is in for follow up.  She admits to some recent stress, as her husband had a liver transplant 2 weeks ago (is doing well) and she started driving to the wrong office this morning.  Admits her BP tends to be a little higher when she is in medical offices.    Blood Pressure Goal:  130/80  Current Medications: olmesartan hctz 20-12.5 mg qd  Family Hx:  pgm - pacemaker, hypertension, hld, father with htn, hld, MI (stents), DM; no siblings; children  Social Hx: no tobacco, some social alcohol, no coffee, does drink caffeine in water boost daily; eats fish, drinking protein shakes (30 gm), chick pea  Diet: avoids beef/pork, recently stopped chicken - mostly plant based diet; low sodium (except french fries)  Exercise: 30 min/day [employer] bike or treadmill, strength training 3-4 times per week  Home BP readings: brought her RPM cuff to the office today, it read within 5 points of office cuff  AM 22 readings average 136/80  HR 69 (range 123-150/77-94)  PM 16 readings average 140/82  HR 61 (range 121-161/74-97)  Intolerances: nkda  Labs: 06/22/22: Na 138, K 4.6, Glu 75, BUN 14, SCr 0.92   TSH 1.760,  Lp(a) 222.3.    Wt Readings from Last 3 Encounters:  07/18/22 160 lb (72.6 kg)  06/22/22 162 lb (73.5 kg)  11/15/20 156 lb (70.8 kg)   BP Readings from Last 3 Encounters:  07/18/22 (!) 157/94  06/22/22 (!) 146/100  11/16/20 (!) 127/51   Pulse Readings from Last 3 Encounters:  06/22/22 (!) 58  11/16/20 64  11/08/20 61    Current Outpatient Medications  Medication Sig Dispense Refill   olmesartan-hydrochlorothiazide (BENICAR HCT) 40-25 MG tablet Take 1 tablet by mouth daily. 30 tablet 6   COVID-19 mRNA Vac-TriS, Pfizer, SUSP injection Inject into the muscle. 0.3 mL 0   estradiol (ESTRACE) 0.1 MG/GM vaginal cream daily.     ezetimibe (ZETIA) 10 MG tablet Take 10 mg by mouth daily.      fexofenadine (ALLEGRA ALLERGY) 180 MG tablet Take 180 mg by mouth daily.     metFORMIN (GLUCOPHAGE-XR) 500 MG 24 hr tablet Take 500 mg by mouth at bedtime.      No current facility-administered medications for this visit.    No Known Allergies  Past Medical History:  Diagnosis Date   Allergy    takes Claritin and uses Flonase daily   Anemia    Cancer ([place])    kidney   Chronic kidney disease    History of bronchitis as a child    Hyperlipidemia    not on any meds   Hypertension  takes HCTZ daily   PCOS (polycystic ovarian syndrome)    Pre-diabetes     Blood pressure (!) 157/94, height '5\' 4"'$  (1.626 m), weight 160 lb (72.6 kg), last menstrual period 08/20/2019.  Hypertension Patient with essential hypertension, improved with addition of olmesartan, but not yet to goal.  Higher than home average in the office today, she was rushed, as she started toward the wrong office today.  Will increase olmesartan hctz to 40/25 and have her continue with regular monitoring.  She will repeat BMET in 2 weeks and return in 4 weeks for follow up, based on the research protocol.     Tommy Medal PharmD CPP [facility] [address] [place], [place]  [address] [phone]

## 2022-07-18 NOTE — Assessment & Plan Note (Signed)
Patient with essential hypertension, improved with addition of olmesartan, but not yet to goal.  Higher than home average in the office today, she was rushed, as she started toward the wrong office today.  Will increase olmesartan hctz to 40/25 and have her continue with regular monitoring.  She will repeat BMET in 2 weeks and return in 4 weeks for follow up, based on the research protocol.

## 2022-07-18 NOTE — Patient Instructions (Signed)
Return for a a follow up appointment August 22 at 8 am  Go to the lab in 2 weeks to check kidney function  Check your blood pressure at home daily (if able) and keep record of the readings.  Take your BP meds as follows:  Increase olmesartan hctz to 2 tablets (40/25 mg) once daily  Bring all of your meds, your BP cuff and your record of home blood pressures to your next appointment.  Exercise as you're able, try to walk approximately 30 minutes per day.  Keep salt intake to a minimum, especially watch canned and prepared boxed foods.  Eat more fresh fruits and vegetables and fewer canned items.  Avoid eating in fast food restaurants.    HOW TO TAKE YOUR BLOOD PRESSURE: Rest 5 minutes before taking your blood pressure.  Don't smoke or drink caffeinated beverages for at least 30 minutes before. Take your blood pressure before (not after) you eat. Sit comfortably with your back supported and both feet on the floor (don't cross your legs). Elevate your arm to heart level on a table or a desk. Use the proper sized cuff. It should fit smoothly and snugly around your bare upper arm. There should be enough room to slip a fingertip under the cuff. The bottom edge of the cuff should be 1 inch above the crease of the elbow. Ideally, take 3 measurements at one sitting and record the average.

## 2022-08-15 ENCOUNTER — Ambulatory Visit: Payer: 59 | Admitting: Pharmacist Clinician (PhC)/ Clinical Pharmacy Specialist

## 2022-08-15 ENCOUNTER — Encounter: Payer: Self-pay | Admitting: Pharmacist Clinician (PhC)/ Clinical Pharmacy Specialist

## 2022-08-15 DIAGNOSIS — I10 Essential (primary) hypertension: Secondary | ICD-10-CM

## 2022-08-15 LAB — BASIC METABOLIC PANEL
BUN/Creatinine Ratio: 15 (ref 9–23)
BUN: 14 mg/dL (ref 6–24)
CO2: 26 mmol/L (ref 20–29)
Calcium: 9.9 mg/dL (ref 8.7–10.2)
Chloride: 98 mmol/L (ref 96–106)
Creatinine, Ser: 0.91 mg/dL (ref 0.57–1.00)
Glucose: 79 mg/dL (ref 70–99)
Potassium: 4.2 mmol/L (ref 3.5–5.2)
Sodium: 137 mmol/L (ref 134–144)
eGFR: 76 mL/min/{1.73_m2} (ref >59)

## 2022-08-15 NOTE — Assessment & Plan Note (Signed)
Patient with essential hypertension, doing well with combination of olmesartan and hydrochlorothiazide.  Will need to repeat labs today as at last visit dose of both medications was doubled.  Praised patient for continuing to work on exercise and other lifestyle modifications to stay healthy.  No changes to medications today, will see her in one month for follow up.

## 2022-08-15 NOTE — Patient Instructions (Signed)
Thank you for choosing [facility] to the lab today to check kidney function and electrolytes  Check your blood pressure at home daily and keep record of the readings.  Take your BP meds as follows:  No changes to medications today.  Bring all of your meds, your BP cuff and your record of home blood pressures to your next appointment.  Exercise as you're able, try to walk approximately 30 minutes per day.  Keep salt intake to a minimum, especially watch canned and prepared boxed foods.  Eat more fresh fruits and vegetables and fewer canned items.  Avoid eating in fast food restaurants.    HOW TO TAKE YOUR BLOOD PRESSURE: Rest 5 minutes before taking your blood pressure.  Don't smoke or drink caffeinated beverages for at least 30 minutes before. Take your blood pressure before (not after) you eat. Sit comfortably with your back supported and both feet on the floor (don't cross your legs). Elevate your arm to heart level on a table or a desk. Use the proper sized cuff. It should fit smoothly and snugly around your bare upper arm. There should be enough room to slip a fingertip under the cuff. The bottom edge of the cuff should be 1 inch above the crease of the elbow. Ideally, take 3 measurements at one sitting and record the average.

## 2022-08-15 NOTE — Progress Notes (Signed)
08/15/2022 ZIONNA HOMEWOOD Nov 04, 1971 [id number]   HPI:  [name] is a 51 y.o. female patient of Dr Oval Linsey, who presents today for hypertension clinic evaluation.  Patient was referred to the advanced hypertension clinic by Dr. Garwin Brothers.  In addition to hypertension, her medical history is significant for hyperlipidemia (LDL 155 - 3./23) and pre-diabetes (A1c 5.8 -3/23).  Labs showed her to have an Lp(a) of 223, however a follow up coronary calcium scan showed a score of 0.  Per patient, she has had problems with hypertension for the past 10 years, being first diagnosed around age 51.   She was seen by Laurann Montana last month and her HCTZ was switched to olmesartan-hctz (20/12.5 mg) combination tablet.   She was agreeable to join our [facility] and was randomized to group 1.  At her first follow up visit pressure was still elevated at 157/94 and olmesartan/hctz was increased to 40/25 mg daily.  She also admitted to stress as her husband had a liver transplant 2 weeks prior.    Today she is in for follow up.  Has no concerns about her medications.  Her husband is recovering well from his transplant and this has eased much of her previous stress.    Blood Pressure Goal:  130/80  Current Medications: olmesartan hctz 40/25 mg qd - pm  Family Hx:  pgm - pacemaker, hypertension, hld, father with htn, hld, MI (stents), DM; no siblings; children  Social Hx: no tobacco, some social alcohol, no coffee, does drink caffeine in water boost daily; eats fish, drinking protein shakes (30 gm), chick pea  Diet: avoids beef/pork, recently stopped chicken - mostly plant based diet; low sodium (except french fries)  Exercise: 30 min/day [employer] bike or treadmill, strength training 3-4 times per week  Home BP readings: brought her RPM cuff to the office today, while last month it read within 5 points, today it was off by 20/10  AM 25 readings average 131/77 HR 70 (range 109-140/67-83)  (last month  avg 136/80)  PM 20 readings average 128/74 HR 69 (range 113-138/67-88)  (last month avg 140/82)  Intolerances: nkda  Labs: 06/22/22: Na 138, K 4.6, Glu 75, BUN 14, SCr 0.92   TSH 1.760, Lp(a) 222.3.    Wt Readings from Last 3 Encounters:  07/18/22 160 lb (72.6 kg)  06/22/22 162 lb (73.5 kg)  11/15/20 156 lb (70.8 kg)   BP Readings from Last 3 Encounters:  08/15/22 120/75  07/18/22 (!) 157/94  06/22/22 (!) 146/100   Pulse Readings from Last 3 Encounters:  08/15/22 62  06/22/22 (!) 58  11/16/20 64    Current Outpatient Medications  Medication Sig Dispense Refill   estradiol (ESTRACE) 0.1 MG/GM vaginal cream daily.     ezetimibe (ZETIA) 10 MG tablet Take 10 mg by mouth daily.      fexofenadine (ALLEGRA ALLERGY) 180 MG tablet Take 180 mg by mouth daily.     metFORMIN (GLUCOPHAGE-XR) 500 MG 24 hr tablet Take 500 mg by mouth at bedtime.      olmesartan-hydrochlorothiazide (BENICAR HCT) 40-25 MG tablet Take 1 tablet by mouth daily. 30 tablet 6   No current facility-administered medications for this visit.    No Known Allergies  Past Medical History:  Diagnosis Date   Allergy    takes Claritin and uses Flonase daily   Anemia    Cancer ([place])    kidney   Chronic kidney disease    History of  bronchitis as a child    Hyperlipidemia    not on any meds   Hypertension    takes HCTZ daily   PCOS (polycystic ovarian syndrome)    Pre-diabetes     Blood pressure 120/75, pulse 62, last menstrual period 08/20/2019.  Hypertension Patient with essential hypertension, doing well with combination of olmesartan and hydrochlorothiazide.  Will need to repeat labs today as at last visit dose of both medications was doubled.  Praised patient for continuing to work on exercise and other lifestyle modifications to stay healthy.  No changes to medications today, will see her in one month for follow up.     Tommy Medal PharmD CPP [facility] [address] [place], [place] [address] [phone]

## 2022-08-18 ENCOUNTER — Telehealth (HOSPITAL_BASED_OUTPATIENT_CLINIC_OR_DEPARTMENT_OTHER): Payer: Self-pay

## 2022-08-18 NOTE — Telephone Encounter (Addendum)
Results called to patient who verbalizes understanding!    ----- Message from Loel Dubonnet, NP sent at 08/16/2022 10:49 AM EDT ----- Normal kidney function, electrolytes.  Continue current medications.

## 2022-09-19 ENCOUNTER — Encounter: Payer: Self-pay | Admitting: Pharmacist Clinician (PhC)/ Clinical Pharmacy Specialist

## 2022-09-19 ENCOUNTER — Ambulatory Visit: Payer: 59 | Attending: Cardiovascular Disease | Admitting: Pharmacist Clinician (PhC)/ Clinical Pharmacy Specialist

## 2022-09-19 DIAGNOSIS — I10 Essential (primary) hypertension: Secondary | ICD-10-CM | POA: Diagnosis not present

## 2022-09-19 NOTE — Progress Notes (Signed)
09/19/2022 [name] 04-11-1971 [id number]   HPI:  [name] is a 51 y.o. female patient of Dr Oval Linsey, who presents today for hypertension clinic evaluation.  Patient was referred to the advanced hypertension clinic by Dr. Garwin Brothers.  In addition to hypertension, her medical history is significant for hyperlipidemia (LDL 155 - 3./23) and pre-diabetes (A1c 5.8 -3/23).  Labs showed her to have an Lp(a) of 223, however a follow up coronary calcium scan showed a score of 0.  Per patient, she has had problems with hypertension for the past 10 years, being first diagnosed around age 73.   She was seen by [name] last month and her HCTZ was switched to olmesartan-hctz (20/12.5 mg) combination tablet.   She was agreeable to join our [place] and was randomized to group 1.  At her first follow up visit pressure was still elevated at 157/94 and olmesartan/hctz was increased to 40/25 mg daily.  She also admitted to stress as her husband had a liver transplant 2 weeks prior.  At her next visit pressure was much improved at 1207/75 and she reported her husband was home and doing well.    Today she is in for her third PharmD visit as part of the [place] study.  Her home BP readings have done very well for the past month, but in the past few days has noticed an increase.  She got her Covid and flu vaccines about that time, and also started taking Allegra-D for worsening allergies.    Blood pressure Goal:  130/80  Current Medications: olmesartan hctz 40/25 mg qhs  Family Hx:  pgm - pacemaker, hypertension, hld, father with htn, hld, MI (stents), DM; no siblings; children  Social Hx: no tobacco, some social alcohol, no coffee, does drink caffeine in water boost daily; eats fish, drinking protein shakes (30 gm), chick pea  Diet: avoids beef/pork, recently stopped chicken - mostly plant based diet; low sodium (except french fries)  Exercise: 30 min/day [place] bike or treadmill, strength  training 3-4 times per week  Home BP readings:  left off readings for last 3 days while on decongestant, as pressure increased by 12-87 points systolic.   AM 23 readings average 128/82  (range 113-146/71-90)  PM 17 readings average 131/79  (range 123-146/69-92)  Intolerances: nkda  Labs: 06/22/22: Na 138, K 4.6, Glu 75, BUN 14, SCr 0.92   TSH 1.760, Lp(a) 222.3.    Wt Readings from Last 3 Encounters:  07/18/22 160 lb (72.6 kg)  06/22/22 162 lb (73.5 kg)  11/15/20 156 lb (70.8 kg)   BP Readings from Last 3 Encounters:  09/19/22 138/82  08/15/22 120/75  07/18/22 (!) 157/94   Pulse Readings from Last 3 Encounters:  09/19/22 74  08/15/22 62  06/22/22 (!) 58    Current Outpatient Medications  Medication Sig Dispense Refill   estradiol (ESTRACE) 0.1 MG/GM vaginal cream daily.     ezetimibe (ZETIA) 10 MG tablet Take 10 mg by mouth daily.      fexofenadine (ALLEGRA ALLERGY) 180 MG tablet Take 180 mg by mouth daily.     metFORMIN (GLUCOPHAGE-XR) 500 MG 24 hr tablet Take 500 mg by mouth at bedtime.      olmesartan-hydrochlorothiazide (BENICAR HCT) 40-25 MG tablet Take 1 tablet by mouth daily. 30 tablet 6   No current facility-administered medications for this visit.    No Known Allergies  Past Medical History:  Diagnosis Date   Allergy  takes Claritin and uses Flonase daily   Anemia    Cancer ([place])    kidney   Chronic kidney disease    History of bronchitis as a child    Hyperlipidemia    not on any meds   Hypertension    takes HCTZ daily   PCOS (polycystic ovarian syndrome)    Pre-diabetes     Blood pressure 138/82, pulse 74, last menstrual period 08/20/2019.  Hypertension Patient with essential hypertension now well controlled at home on olmesartan hctz.  Encouraged her to discontinue Allegra-D, as it is most likely the cause of her recently increased readings.  Will have her continue with her current medications and home monitoring.  She is scheduled to see Dr.  Oval Linsey in October to finish out the RPM study.    Tommy Medal PharmD CPP [facility] [address] [place], [place] [address] [phone]

## 2022-09-19 NOTE — Patient Instructions (Signed)
Return for a a follow up appointment with Dr. Oval Linsey on October 27 at 8:45 am  Check your blood pressure at home daily and keep record of the readings.  Take your BP meds as follows:  Continue with your current medications  Bring all of your meds, your BP cuff and your record of home blood pressures to your next appointment.  Exercise as you're able, try to walk approximately 30 minutes per day.  Keep salt intake to a minimum, especially watch canned and prepared boxed foods.  Eat more fresh fruits and vegetables and fewer canned items.  Avoid eating in fast food restaurants.    HOW TO TAKE YOUR BLOOD PRESSURE: Rest 5 minutes before taking your blood pressure.  Don't smoke or drink caffeinated beverages for at least 30 minutes before. Take your blood pressure before (not after) you eat. Sit comfortably with your back supported and both feet on the floor (don't cross your legs). Elevate your arm to heart level on a table or a desk. Use the proper sized cuff. It should fit smoothly and snugly around your bare upper arm. There should be enough room to slip a fingertip under the cuff. The bottom edge of the cuff should be 1 inch above the crease of the elbow. Ideally, take 3 measurements at one sitting and record the average.

## 2022-09-19 NOTE — Assessment & Plan Note (Signed)
Patient with essential hypertension now well controlled at home on olmesartan hctz.  Encouraged her to discontinue Allegra-D, as it is most likely the cause of her recently increased readings.  Will have her continue with her current medications and home monitoring.  She is scheduled to see Dr. Oval Linsey in October to finish out the RPM study.

## 2022-10-19 NOTE — Progress Notes (Signed)
Advanced Hypertension Clinic Follow-up:    Date:  10/20/2022   ID:  [name], DOB Feb 09, 1971, MRN [id number]  PCP:  No primary care provider on file.  Cardiologist:  Skeet Latch, MD  Nephrologist:  Referring MD: Servando Salina, MD   CC: Hypertension  History of Present Illness:    [name] is a 51 y.o. female with a hx of hypertension, anemia, hyperlipidemia, prediabetes, CKD, and kidney cancer, here for follow-up. She was initially seen by Laurann Montana, NP on 06/22/2022 in the Advanced Hypertension Clinic. At her initial visit her blood pressure was 146/100. HCTZ was stopped and she was started on olmesartan/HCTZ. She reported myalgias on atorvastatin and held her statin for 2 weeks. Lpa was elevated and a coronary calcium score was recommended. Her calcium score 06/2022 was 0. She followed up with our pharmacist and enrolled in the remote patient monitoring study. Olmesartan/HCTZ was increased and blood pressure was 120/75 at follow-up. She last saw Erasmo Downer 08/2022 and blood pressure was 138/82 but had been better controlled at home.   Today, she confirms that she has not been checking her blood pressure routinely. When she does, her at home readings are usually 120-130/77-85. Every morning she has a caffeinated drink which she believes raises her blood pressures to the 140s. Her blood pressure is closer to the mid 025E systolic in the evenings. In clinic today her blood pressure is 155/76 (138/92 on recheck) which she attributes to white coat hypertension. Lately she is exercising regularly using the treadmill or stationary bike for 30 min [place] exercises. She denies any exertional or anginal symptoms. However, she notes that when she runs she feels as though her breathing is more difficult. She continues to work on her dietary changes. About 2 years ago she stopped eating beef and pork. Since 01/2022 she has cut out chicken and [place] from her diet. However, she admits to  struggling with finding meat and protein alternatives. Recently she is trying [employer] brand plant-based meats. She frequently has sweets, breads, and fried foods in place of meats. For lunch she does try to have a salad. She denies any palpitations, chest pain, or peripheral edema. No lightheadedness, headaches, syncope, orthopnea, or PND.   Previous antihypertensives:  Past Medical History:  Diagnosis Date   Allergy    takes Claritin and uses Flonase daily   Anemia    Cancer ([place])    kidney   Chronic kidney disease    History of bronchitis as a child    Hypertension    takes HCTZ daily   PCOS (polycystic ovarian syndrome)    Pre-diabetes    Pure hypercholesterolemia 10/20/2022    Past Surgical History:  Procedure Laterality Date   CERVICAL CERCLAGE     CHOLECYSTECTOMY N/A 04/13/2017   Procedure: LAPAROSCOPIC CHOLECYSTECTOMY;  Surgeon: Clovis Riley, MD;  Location: [facility];  Service: General;  Laterality: N/A;   COLONOSCOPY     cyst removed from ovary     [place] Right 11/15/2020   Procedure: XI ROBOTIC [place];  Surgeon: Janith Lima, MD;  Location: [facility];  Service: Urology;  Laterality: Right;   ROBOTIC ASSISTED LAPAROSCOPIC HYSTERECTOMY AND SALPINGECTOMY Bilateral 08/28/2019   Procedure: XI ROBOTIC ASSISTED LAPAROSCOPIC HYSTERECTOMY AND SALPINGECTOMY;  Surgeon: Servando Salina, MD;  Location: [facility];  Service: Gynecology;  Laterality: Bilateral;  Gaylord Shih RNFA   UTERINE ARTERY EMBOLIZATION     wisdom teeth extracted      Current Medications: Current Meds  Medication Sig   estradiol (ESTRACE) 0.1 MG/GM vaginal cream daily.   ezetimibe (ZETIA) 10 MG tablet Take 10 mg by mouth daily.    fexofenadine (ALLEGRA ALLERGY) 180 MG tablet Take 180 mg by mouth daily.   metFORMIN (GLUCOPHAGE-XR) 500 MG  24 hr tablet Take 500 mg by mouth at bedtime.    olmesartan-hydrochlorothiazide (BENICAR HCT) 40-25 MG tablet Take 1 tablet by mouth daily.     Allergies:   Rosuvastatin   Social History   Socioeconomic History   Marital status: Married    Spouse name: Not on file   Number of children: Not on file   Years of education: Not on file   Highest education level: Not on file  Occupational History   Not on file  Tobacco Use   Smoking status: Never   Smokeless tobacco: Never  Vaping Use   Vaping Use: Never used  Substance and Sexual Activity   Alcohol use: Yes    Comment: social   Drug use: No   Sexual activity: Not on file  Other Topics Concern   Not on file  Social History Narrative   Not on file   Social Determinants of Health   Financial Resource Strain: Low Risk  (06/22/2022)   Overall Financial Resource Strain (CARDIA)    Difficulty of Paying Living Expenses: Not hard at all  Food Insecurity: No Food Insecurity (06/22/2022)   Hunger Vital Sign    Worried About Running Out of Food in the Last Year: Never true    [place] in the Last Year: Never true  Transportation Needs: No Transportation Needs (06/22/2022)   PRAPARE - Hydrologist (Medical): No    Lack of Transportation (Non-Medical): No  Physical Activity: Not on file  Stress: No Stress Concern Present (06/22/2022)   [place]    Feeling of Stress : Not at all  Social Connections: Socially Integrated (06/22/2022)   Social Connection and Isolation Panel [NHANES]    Frequency of Communication with Friends and Family: More than three times a week    Frequency of Social Gatherings with Friends and Family: Once a week    Attends Religious Services: More than 4 times per year    Active Member of [employer] or Organizations: Yes    Attends Music therapist: More than 4 times per year    Marital Status: Married      Family History: The patient's family history includes Cancer in her father and another family member; Diabetes in her father; Heart disease in her father; Hypertension in her father; Sleep apnea in an other family member.  ROS:   Please see the history of present illness.    All other systems reviewed and are negative.  EKGs/Labs/Other Studies Reviewed:    CT Calcium Score  06/29/2022: FINDINGS: Coronary arteries: Normal origins.   Coronary Calcium Score:   Left main: 0   Left anterior descending artery: 0   Left circumflex artery: 0   Right coronary artery: 0   Total: 0   Percentile: 0   Pericardium: Normal.   Aorta: Normal caliber.   Non-cardiac: See separate report from [facility].   IMPRESSION: Coronary calcium score of 0. This is a low risk study.   EKG:  EKG is personally reviewed. 10/20/2022:  EKG was not ordered.  Recent Labs: 06/22/2022: TSH 1.760 08/15/2022: BUN 14; Creatinine, Ser 0.91; Potassium 4.2; Sodium 137   Recent Lipid Panel No results found for: "CHOL", "TRIG", "HDL", "CHOLHDL", "VLDL", "LDLCALC", "LDLDIRECT"  Physical Exam:    VS:  BP (!) 138/92 (BP Location: Left Arm, Patient Position: Sitting, Cuff Size: Normal)   Pulse 68   Ht '5\' 4"'$  (1.626 m)   Wt 166 lb 6.4 oz (75.5 kg)   LMP 08/20/2019 Comment: camila  SpO2 100%   BMI 28.56 kg/m  , BMI Body mass index is 28.56 kg/m. GENERAL:  Well appearing HEENT: Pupils equal round and reactive, fundi not visualized, oral mucosa unremarkable NECK:  No jugular venous distention, waveform within normal limits, carotid upstroke brisk and symmetric, no bruits, no thyromegaly LUNGS:  Clear to auscultation bilaterally HEART:  RRR.  PMI not displaced or sustained,S1 and S2 within normal limits, no S3, no S4, no clicks, no rubs, no murmurs ABD:  Flat, positive bowel sounds normal in frequency in pitch, no bruits, no rebound, no guarding, no midline pulsatile mass, no hepatomegaly, no  splenomegaly EXT:  2 plus pulses throughout, no edema, no cyanosis no clubbing SKIN:  No rashes no nodules NEURO:  Cranial nerves II through XII grossly intact, motor grossly intact throughout PSYCH:  Cognitively intact, oriented to person place and time   ASSESSMENT/PLAN:    Hypertension Blood pressures been generally well controlled at home.  She has central hypertension with some superimposed whitecoat hypertension.  Blood pressures are very controlled in the morning and then throughout the day they start to creep up into the 130s.  She is going to keep working on diet and exercise for now.  Continue olmesartan and HCTZ.  She will check her blood pressures twice daily and bring to follow-up in 6 months.  Pure hypercholesterolemia LDL is elevated and LP(a) was high as well.  Calcium score was 0.  Keep working on diet and exercise as above.  She is transitioning towards being more plant-based.  Continue Zetia.  She did not tolerate rosuvastatin.  She was congratulated on her excellent exercise routine.  Plan to repeat a calcium score in 5 years.   Screening for Secondary Hypertension:     06/23/2022    4:00 PM  Causes  Drugs/Herbals Screened  Thyroid Disease Screened  Coarctation of the Aorta Screened     - Comments BP symmetrical  Compliance Screened    Relevant Labs/Studies:    Latest Ref Rng & Units 08/15/2022    8:44 AM 06/22/2022   10:32 AM 11/16/2020    4:22 AM  Basic Labs  Sodium 134 - 144 mmol/L 137  138  135   Potassium 3.5 - 5.2 mmol/L 4.2  4.6  3.3   Creatinine 0.57 - 1.00 mg/dL 0.91  0.92  1.01        Latest Ref Rng & Units 06/22/2022   10:32 AM  Thyroid   TSH 0.450 - 4.500 uIU/mL 1.760      Disposition:    FU with Mahreen Schewe C. Oval Linsey, MD, [facility] in 6 months.  Medication Adjustments/Labs and Tests Ordered: Current medicines are reviewed at length with the patient today.  Concerns regarding medicines are outlined above.   No orders of the defined types were  placed in this encounter.  No orders of the defined types were placed in this encounter.  I,Mathew Stumpf,acting as a Education administrator for Skeet Latch, MD.,have documented all relevant documentation  on the behalf of Skeet Latch, MD,as directed by  Skeet Latch, MD while in the presence of Skeet Latch, MD.  I, [place] Oval Linsey, MD have reviewed all documentation for this visit.  The documentation of the exam, diagnosis, procedures, and orders on 10/20/2022 are all accurate and complete.   Signed, Skeet Latch, MD  10/20/2022 9:58 AM    [place]

## 2022-10-20 ENCOUNTER — Encounter (HOSPITAL_BASED_OUTPATIENT_CLINIC_OR_DEPARTMENT_OTHER): Payer: Self-pay | Admitting: Cardiovascular Disease

## 2022-10-20 ENCOUNTER — Ambulatory Visit (HOSPITAL_BASED_OUTPATIENT_CLINIC_OR_DEPARTMENT_OTHER): Payer: 59 | Admitting: Cardiovascular Disease

## 2022-10-20 DIAGNOSIS — E7800 Pure hypercholesterolemia, unspecified: Secondary | ICD-10-CM | POA: Diagnosis not present

## 2022-10-20 DIAGNOSIS — Z006 Encounter for examination for normal comparison and control in clinical research program: Secondary | ICD-10-CM

## 2022-10-20 DIAGNOSIS — I10 Essential (primary) hypertension: Secondary | ICD-10-CM

## 2022-10-20 HISTORY — DX: Pure hypercholesterolemia, unspecified: E78.00

## 2022-10-20 NOTE — Research (Signed)
I saw pt today after Dr. Blenda Mounts follow up visit. Pt is in Dr. Blenda Mounts Virtual Care HTN Study. Pt filled out research survey. Pt was enrolled in Group 1. Pt has successfully reached her blood pressure goal and completed the Virtual Care HTN Study.

## 2022-10-20 NOTE — Patient Instructions (Addendum)
Medication Instructions:  Your physician recommends that you continue on your current medications as directed. Please refer to the Current Medication list given to you today.   Labwork: NONE  Testing/Procedures: NONE  Follow-Up: 35 MONTHS WITH DR [place] W NP   Any Other Special Instructions Will Be Listed Below (If Applicable).  1 WEEK PRIOR TO YOUR FOLLOW UP CHECK YOUR BLOOD PRESSURE TWICE A DAY AND LOG, BRING WITH YOU TO YOUR FOLLOW UP

## 2022-10-20 NOTE — Assessment & Plan Note (Addendum)
LDL is elevated and LP(a) was high as well.  Calcium score was 0.  Keep working on diet and exercise as above.  She is transitioning towards being more plant-based.  Continue Zetia.  She did not tolerate rosuvastatin.  She was congratulated on her excellent exercise routine.  Plan to repeat a calcium score in 5 years.

## 2022-10-20 NOTE — Assessment & Plan Note (Signed)
Blood pressures been generally well controlled at home.  She has central hypertension with some superimposed whitecoat hypertension.  Blood pressures are very controlled in the morning and then throughout the day they start to creep up into the 130s.  She is going to keep working on diet and exercise for now.  Continue olmesartan and HCTZ.  She will check her blood pressures twice daily and bring to follow-up in 6 months.

## 2022-11-14 ENCOUNTER — Ambulatory Visit (HOSPITAL_COMMUNITY)
Admission: RE | Admit: 2022-11-14 | Discharge: 2022-11-14 | Disposition: A | Discharge status: Home / Self Care | Payer: 59 | Source: Ambulatory Visit | Attending: Urology | Admitting: Urology

## 2022-11-14 ENCOUNTER — Other Ambulatory Visit (HOSPITAL_COMMUNITY): Payer: Self-pay | Admitting: Urology

## 2022-11-14 DIAGNOSIS — I82220 Acute embolism and thrombosis of inferior vena cava: Secondary | ICD-10-CM | POA: Insufficient documentation

## 2022-11-14 DIAGNOSIS — D49511 Neoplasm of unspecified behavior of right kidney: Secondary | ICD-10-CM

## 2023-01-29 ENCOUNTER — Other Ambulatory Visit: Payer: Self-pay | Admitting: Cardiovascular Disease

## 2023-05-27 ENCOUNTER — Encounter (HOSPITAL_BASED_OUTPATIENT_CLINIC_OR_DEPARTMENT_OTHER): Payer: Self-pay | Admitting: Cardiovascular Disease

## 2023-05-28 MED ORDER — OLMESARTAN MEDOXOMIL-HCTZ 40-25 MG PO TABS: 1.0000 | ORAL_TABLET | Freq: Every day | ORAL | 0 refills | Status: DC

## 2023-07-11 NOTE — Progress Notes (Addendum)
[facility] Follow-up:    Date:  07/12/2023   ID:  [name], [name] 03/22/1971, MRN [id number]  PCP:  Ollen Bowl, MD  Cardiologist:  Chilton Si, MD  Nephrologist:  Referring MD: Maurice Small, MD   CC: Hypertension  History of Present Illness:    [name] is a 52 y.o. female with a hx of hypertension, anemia, hyperlipidemia, prediabetes, CKD, and kidney cancer, here for follow-up. She was initially seen by Gillian Shields, NP on 06/22/2022 in the [facility]. At her initial visit her blood pressure was 146/100. HCTZ was stopped and she was started on olmesartan/HCTZ. She reported myalgias on atorvastatin and held her statin for 2 weeks. Lpa was elevated and a coronary calcium score was recommended. Her calcium score 06/2022 was 0. She followed up with our pharmacist and enrolled in the remote patient monitoring study. Olmesartan/HCTZ was increased and blood pressure was 120/75 at follow-up. She last saw Belenda Cruise 08/2022 and blood pressure was 138/82 but had been better controlled at home.   At her visit 09/2022, home blood pressures were generally well controlled, gradually increasing to the 130s systolic throughout the day. Planned to keep working on her diet and exercise for now. Continued olmesartan and HCTZ.  Today, she is feeling okay. She presents a blood pressure log which has been averaging 120s/70s with occasional 130s systolic. At one point her BP was 149 systolic but had improved on recheck. Woke up with a headache this morning, but it has been a long while since having a headache. This morning her blood pressure was 138 mmHg. Lately she is doing well working on her lifestyle changes. She has been exercising every day with weight lifting, treadmill, Peloton app, etc. She tries to work out at least 30 minutes each time. She has cut out all meat except for fish. She believes her protein intake has been in adequate. Previously tried protein  shakes but she believes they caused her to gain some weight. Tracking her fruits and vegetables, trying to consume a diverse variety each week. Despite her changes, she has not noticed any weight loss. She has mostly been stable around 165 lbs at home. Confirmed that rosuvastatin previously caused her to develop headaches. She denies any palpitations, chest pain, shortness of breath, peripheral edema, lightheadedness, syncope, orthopnea, or PND.  Previous antihypertensives:  Past Medical History:  Diagnosis Date   Allergy    takes Claritin and uses Flonase daily   Anemia    Cancer (HCC)    kidney   Chronic kidney disease    History of bronchitis as a child    Hypertension    takes HCTZ daily   PCOS (polycystic ovarian syndrome)    Pre-diabetes    Pure hypercholesterolemia 10/20/2022    Past Surgical History:  Procedure Laterality Date   CERVICAL CERCLAGE     CHOLECYSTECTOMY N/A 04/13/2017   Procedure: LAPAROSCOPIC CHOLECYSTECTOMY;  Surgeon: Berna Bue, MD;  Location: [facility];  Service: General;  Laterality: N/A;   COLONOSCOPY     cyst removed from ovary     DILATION AND CURETTAGE OF UTERUS     NASAL SINUS SURGERY     REFRACTIVE SURGERY     ROBOT ASSISTED LAPAROSCOPIC NEPHRECTOMY Right 11/15/2020   Procedure: XI ROBOTIC ASSISTED LAPAROSCOPIC RADICAL NEPHRECTOMY;  Surgeon: Jannifer Hick, MD;  Location: [facility];  Service: Urology;  Laterality: Right;   ROBOTIC ASSISTED LAPAROSCOPIC HYSTERECTOMY AND SALPINGECTOMY Bilateral 08/28/2019   Procedure: XI  ROBOTIC ASSISTED LAPAROSCOPIC HYSTERECTOMY AND SALPINGECTOMY;  Surgeon: Maxie Better, MD;  Location: [facility];  Service: Gynecology;  Laterality: Bilateral;  Karmen Stabs RNFA   UTERINE ARTERY EMBOLIZATION     wisdom teeth extracted      Current Medications: Current Meds  Medication Sig   atovaquone-proguanil (MALARONE) 250-100 MG TABS tablet Take 1 tablet by mouth. For travel   estradiol (ESTRACE) 0.1  MG/GM vaginal cream daily.   ezetimibe (ZETIA) 10 MG tablet Take 10 mg by mouth daily.    fexofenadine (ALLEGRA ALLERGY) 180 MG tablet Take 180 mg by mouth daily.   metFORMIN (GLUCOPHAGE-XR) 500 MG 24 hr tablet Take 500 mg by mouth at bedtime.    olmesartan-hydrochlorothiazide (BENICAR HCT) 40-25 MG tablet Take 1 tablet by mouth daily. Must have office visit for further refills.   triamcinolone ointment (KENALOG) 0.1 % Apply 1 Application topically daily.     Allergies:   Rosuvastatin   Social History   Socioeconomic History   Marital status: Married    Spouse name: Not on file   Number of children: Not on file   Years of education: Not on file   Highest education level: Not on file  Occupational History   Not on file  Tobacco Use   Smoking status: Never   Smokeless tobacco: Never  Vaping Use   Vaping status: Never Used  Substance and Sexual Activity   Alcohol use: Yes    Comment: social   Drug use: No   Sexual activity: Not on file  Other Topics Concern   Not on file  Social History Narrative   Not on file   Social Determinants of Health   Financial Resource Strain: Low Risk  (06/22/2022)   Overall Financial Resource Strain (CARDIA)    Difficulty of Paying Living Expenses: Not hard at all  Food Insecurity: No Food Insecurity (06/22/2022)   Hunger Vital Sign    Worried About Running Out of Food in the Last Year: Never true    Ran Out of Food in the Last Year: Never true  Transportation Needs: No Transportation Needs (06/22/2022)   PRAPARE - Administrator, Civil Service (Medical): No    Lack of Transportation (Non-Medical): No  Physical Activity: Not on file  Stress: No Stress Concern Present (06/22/2022)   [employer] of Occupational Health - Occupational Stress Questionnaire    Feeling of Stress : Not at all  Social Connections: Socially Integrated (06/22/2022)   Social Connection and Isolation Panel [NHANES]    Frequency of Communication with  Friends and Family: More than three times a week    Frequency of Social Gatherings with Friends and Family: Once a week    Attends Religious Services: More than 4 times per year    Active Member of [employer] or Organizations: Yes    Attends Engineer, structural: More than 4 times per year    Marital Status: Married     Family History: The patient's family history includes Cancer in her father and another family member; Diabetes in her father; Heart disease in her father; Hypertension in her father; Sleep apnea in an other family member.  ROS:   Please see the history of present illness.    (+) Headache All other systems reviewed and are negative.  EKGs/Labs/Other Studies Reviewed:    CT Calcium Score  06/29/2022: FINDINGS:   IMPRESSION: Coronary calcium score of 0. This is a low risk study.  EKG:  EKG is personally  reviewed. 07/12/2023:  Sinus rhythm. Rate 75 bpm.  Nonspecific TW abnormalities  Recent Labs: 08/15/2022: BUN 14; Creatinine, Ser 0.91; Potassium 4.2; Sodium 137   Recent Lipid Panel No results found for: "CHOL", "TRIG", "HDL", "CHOLHDL", "VLDL", "LDLCALC", "LDLDIRECT"  Physical Exam:    VS:  BP 138/82 (BP Location: Left Arm, Patient Position: Sitting, Cuff Size: Normal)   Pulse 75   Ht 5\' 4"  (1.626 m)   Wt 170 lb 6.4 oz (77.3 kg)   LMP 08/20/2019 Comment: camila  BMI 29.25 kg/m  , BMI Body mass index is 29.25 kg/m. GENERAL:  Well appearing HEENT: Pupils equal round and reactive, fundi not visualized, oral mucosa unremarkable NECK:  No jugular venous distention, waveform within normal limits, carotid upstroke brisk and symmetric, no bruits, no thyromegaly LUNGS:  Clear to auscultation bilaterally HEART:  RRR.  PMI not displaced or sustained,S1 and S2 within normal limits, no S3, no S4, no clicks, no rubs, no murmurs ABD:  Flat, positive bowel sounds normal in frequency in pitch, no bruits, no rebound, no guarding, no midline pulsatile mass, no hepatomegaly,  no splenomegaly EXT:  2 plus pulses throughout, no edema, no cyanosis no clubbing SKIN:  No rashes no nodules NEURO:  Cranial nerves II through XII grossly intact, motor grossly intact throughout PSYCH:  Cognitively intact, oriented to person place and time   ASSESSMENT/PLAN:    # Hypertension:  # White coat hypertension:  Blood pressure mostly controlled with occasional readings in the 130s-140s. Patient is adhering to a regular exercise routine and has made dietary changes. Discussed the possibility of adding amlodipine for tighter control. -Continue current antihypertensive medication. -Check blood pressure twice daily for the next few weeks and report averages.  BP goal <130/80.  # Hyperlipidemia: LDL cholesterol increased to 164. Lp(a) also elevated.  Calcium score was 0 in 2023.  Patient has made dietary changes and is exercising regularly. No signs of plaque buildup on previous calcium score. -Continue current lifestyle modifications. -Repeat calcium score in 2026. -Check cholesterol annually and share results.  # Weight Management: Despite regular exercise and dietary changes, patient has difficulty losing weight. Discussed the possibility of increased muscle mass and the impact of PCOS on weight management. -Continue current exercise and dietary regimen. -Consider increasing cardio and reducing carbohydrate intake.  # Headaches: Patient reported a recent headache but has not been experiencing them frequently. Blood pressure at the time of the headache was not significantly elevated. -Monitor for any increase in frequency or severity of headaches.  General Health Maintenance: -Continue regular exercise and dietary modifications. -Check cholesterol annually and share results. -Plan for a follow-up visit in one year, or sooner if needed.       Screening for Secondary Hypertension:     06/23/2022    4:00 PM  Causes  Drugs/Herbals Screened  Thyroid Disease Screened   Coarctation of the Aorta Screened     - Comments BP symmetrical  Compliance Screened    Relevant Labs/Studies:    Latest Ref Rng & Units 08/15/2022    8:44 AM 06/22/2022   10:32 AM 11/16/2020    4:22 AM  Basic Labs  Sodium 134 - 144 mmol/L 137  138  135   Potassium 3.5 - 5.2 mmol/L 4.2  4.6  3.3   Creatinine 0.57 - 1.00 mg/dL 1.61  0.96  0.45        Latest Ref Rng & Units 06/22/2022   10:32 AM  Thyroid   TSH 0.450 - 4.500 uIU/mL  1.760    Disposition:    FU with Tjay Velazquez C. Duke Salvia, MD, [facility] in 1 year.   Medication Adjustments/Labs and Tests Ordered: Current medicines are reviewed at length with the patient today.  Concerns regarding medicines are outlined above.   Orders Placed This Encounter  Procedures   EKG 12-Lead   No orders of the defined types were placed in this encounter.  I,Mathew Stumpf,acting as a Neurosurgeon for Chilton Si, MD.,have documented all relevant documentation on the behalf of Chilton Si, MD,as directed by  Chilton Si, MD while in the presence of Chilton Si, MD.  I, Fallon Haecker C. Duke Salvia, MD have reviewed all documentation for this visit.  The documentation of the exam, diagnosis, procedures, and orders on 07/12/2023 are all accurate and complete.  Signed, Chilton Si, MD  07/12/2023 9:13 AM    [facility]

## 2023-07-12 ENCOUNTER — Ambulatory Visit (HOSPITAL_BASED_OUTPATIENT_CLINIC_OR_DEPARTMENT_OTHER): Payer: 59 | Admitting: Cardiovascular Disease

## 2023-07-12 ENCOUNTER — Encounter (HOSPITAL_BASED_OUTPATIENT_CLINIC_OR_DEPARTMENT_OTHER): Payer: Self-pay | Admitting: Cardiovascular Disease

## 2023-07-12 VITALS — BP 138/82 | HR 75 | Ht 64.0 in | Wt 170.4 lb

## 2023-07-12 DIAGNOSIS — I10 Essential (primary) hypertension: Secondary | ICD-10-CM

## 2023-07-12 NOTE — Patient Instructions (Signed)
Medication Instructions:  Your physician recommends that you continue on your current medications as directed. Please refer to the Current Medication list given to you today.  Follow-Up: 1 year with Dr. Duke Salvia ( General Cards)   Special Instructions:  Please check your blood pressure twice a day for two weeks and then send [place] a message with those numbers.

## 2023-07-29 ENCOUNTER — Encounter (HOSPITAL_BASED_OUTPATIENT_CLINIC_OR_DEPARTMENT_OTHER): Payer: Self-pay | Admitting: Cardiovascular Disease

## 2023-07-30 MED ORDER — AMLODIPINE BESYLATE 5 MG PO TABS: 5.0000 mg | ORAL_TABLET | Freq: Every day | ORAL | 3 refills | Status: DC

## 2023-07-30 NOTE — Telephone Encounter (Signed)
Please advise 

## 2023-07-30 NOTE — Addendum Note (Signed)
Addended by: Marlene Lard on: 07/30/2023 09:40 AM   Modules accepted: Orders

## 2023-07-30 NOTE — Telephone Encounter (Signed)
BP not at goal <130/80. Add Amlodipine 5mg  every day. May take with her other BP meds for easier once daily dosing. Provide BP update in 2 weeks.   Alver Sorrow, NP

## 2023-08-06 ENCOUNTER — Telehealth: Payer: 59 | Admitting: Physician Assistant

## 2023-08-06 DIAGNOSIS — B9689 Other specified bacterial agents as the cause of diseases classified elsewhere: Secondary | ICD-10-CM | POA: Diagnosis not present

## 2023-08-06 DIAGNOSIS — J208 Acute bronchitis due to other specified organisms: Secondary | ICD-10-CM | POA: Diagnosis not present

## 2023-08-06 MED ORDER — BENZONATATE 100 MG PO CAPS: 100.0000 mg | ORAL_CAPSULE | Freq: Three times a day (TID) | ORAL | 0 refills | Status: DC | PRN

## 2023-08-06 MED ORDER — AZITHROMYCIN 250 MG PO TABS: ORAL_TABLET | ORAL | 0 refills | Status: AC

## 2023-08-06 NOTE — Progress Notes (Signed)

## 2023-08-07 MED ORDER — ALBUTEROL SULFATE HFA 108 (90 BASE) MCG/ACT IN AERS: 2.0000 | INHALATION_SPRAY | Freq: Four times a day (QID) | RESPIRATORY_TRACT | 0 refills | Status: DC | PRN

## 2023-08-07 NOTE — Addendum Note (Signed)
Addended by: Waldon Merl on: 08/07/2023 04:53 PM   Modules accepted: Orders

## 2023-08-24 ENCOUNTER — Encounter (HOSPITAL_COMMUNITY): Payer: Self-pay | Admitting: *Deleted

## 2023-08-24 ENCOUNTER — Ambulatory Visit (INDEPENDENT_AMBULATORY_CARE_PROVIDER_SITE_OTHER): Payer: 59

## 2023-08-24 ENCOUNTER — Ambulatory Visit (HOSPITAL_COMMUNITY)
Admission: EM | Admit: 2023-08-24 | Discharge: 2023-08-24 | Disposition: A | Discharge status: Home / Self Care | Payer: 59 | Attending: Internal Medicine | Admitting: Internal Medicine

## 2023-08-24 ENCOUNTER — Other Ambulatory Visit: Payer: Self-pay

## 2023-08-24 DIAGNOSIS — R059 Cough, unspecified: Secondary | ICD-10-CM | POA: Insufficient documentation

## 2023-08-24 DIAGNOSIS — J209 Acute bronchitis, unspecified: Secondary | ICD-10-CM

## 2023-08-24 DIAGNOSIS — U071 COVID-19: Secondary | ICD-10-CM | POA: Insufficient documentation

## 2023-08-24 DIAGNOSIS — R509 Fever, unspecified: Secondary | ICD-10-CM | POA: Diagnosis not present

## 2023-08-24 MED ORDER — CYCLOBENZAPRINE HCL 10 MG PO TABS: 5.0000 mg | ORAL_TABLET | Freq: Two times a day (BID) | ORAL | 0 refills | Status: DC | PRN

## 2023-08-24 MED ORDER — LEVOFLOXACIN 500 MG PO TABS: 500.0000 mg | ORAL_TABLET | Freq: Every day | ORAL | 0 refills | Status: DC

## 2023-08-24 MED ORDER — PROMETHAZINE-DM 6.25-15 MG/5ML PO SYRP: 5.0000 mL | ORAL_SOLUTION | Freq: Four times a day (QID) | ORAL | 0 refills | Status: DC | PRN

## 2023-08-24 NOTE — Discharge Instructions (Signed)
Covid test will be resulted tomorrow. Mychart will show the results, we will call if positive. For tonight, socially distant from family. Rest, hydrate. Start levaquin daily for 10 days, take with food. Cough medication 4 times daily as needed. Flexeril q6 hours as needed.

## 2023-08-24 NOTE — ED Triage Notes (Addendum)
Pt reports cough is worse . Last night  Pt had a HA, chills,muscle aches. Pt had a e-visit 2 weeks ago and took anti-bx . Pt reported she wanted to take her own tylenol she had in her bag

## 2023-08-24 NOTE — ED Provider Notes (Signed)
[facility]    CSN: [id number] Arrival date & time: 08/24/23  1629      History   Chief Complaint Chief Complaint  Patient presents with   Cough    HPI [name] is a 52 y.o. female.   Bro89 year old female who presents to urgent care with a 4-week history of worsening cough now with fevers, chills and muscle aches.  She reports after 2 weeks of having symptoms she did an e-visit and during that visit she was giving Z-Pak.  She contacted them after her symptoms did not resolve and they gave her Tessalon pearls.  She also did an over-the-counter COVID test.  And this was negative. She reports that none of this has helped and her symptoms have continued to worsen.  She was having significant chills today but continued to go to work.  She reports the muscle aches worsened as well.  She did not realize that she had been running a fever.  She had also relates that her husband is a transplant patient.     Cough Associated symptoms: chills and fever   Associated symptoms: no ear pain, no rash, no shortness of breath, no sore throat and no wheezing     Past Medical History:  Diagnosis Date   Allergy    takes Claritin and uses Flonase daily   Anemia    Cancer (HCC)    kidney   Chronic kidney disease    History of bronchitis as a child    Hypertension    takes HCTZ daily   PCOS (polycystic ovarian syndrome)    Pre-diabetes    Pure hypercholesterolemia 10/20/2022    Patient Active Problem List   Diagnosis Date Noted   Pure hypercholesterolemia 10/20/2022   Hypertension 07/18/2022   Renal mass 11/15/2020   Uterine fibroid 08/28/2019   Status post total hysterectomy 08/28/2019    Past Surgical History:  Procedure Laterality Date   CERVICAL CERCLAGE     CHOLECYSTECTOMY N/A 04/13/2017   Procedure: LAPAROSCOPIC CHOLECYSTECTOMY;  Surgeon: Berna Bue, MD;  Location: [facility];  Service: General;  Laterality: N/A;   COLONOSCOPY     cyst removed from ovary      DILATION AND CURETTAGE OF UTERUS     NASAL SINUS SURGERY     REFRACTIVE SURGERY     ROBOT ASSISTED LAPAROSCOPIC NEPHRECTOMY Right 11/15/2020   Procedure: XI ROBOTIC ASSISTED LAPAROSCOPIC RADICAL NEPHRECTOMY;  Surgeon: Jannifer Hick, MD;  Location: [facility];  Service: Urology;  Laterality: Right;   ROBOTIC ASSISTED LAPAROSCOPIC HYSTERECTOMY AND SALPINGECTOMY Bilateral 08/28/2019   Procedure: XI ROBOTIC ASSISTED LAPAROSCOPIC HYSTERECTOMY AND SALPINGECTOMY;  Surgeon: Maxie Better, MD;  Location: [facility];  Service: Gynecology;  Laterality: Bilateral;  Karmen Stabs RNFA   UTERINE ARTERY EMBOLIZATION     wisdom teeth extracted      OB History   No obstetric history on file.      Home Medications    Prior to Admission medications   Medication Sig Start Date End Date Taking? Authorizing Provider  albuterol (VENTOLIN HFA) 108 (90 Base) MCG/ACT inhaler Inhale 2 puffs into the lungs every 6 (six) hours as needed for wheezing or shortness of breath. 08/07/23  Yes Waldon Merl, PA-C  benzonatate (TESSALON) 100 MG capsule Take 1 capsule (100 mg total) by mouth 3 (three) times daily as needed. 08/06/23  Yes Margaretann Loveless, PA-C  estradiol (ESTRACE) 0.1 MG/GM vaginal cream daily.   Yes [provider]  ezetimibe (ZETIA) 10 MG tablet Take 10 mg by mouth daily.  04/26/20  Yes [provider]  fexofenadine (ALLEGRA ALLERGY) 180 MG tablet Take 180 mg by mouth daily.   Yes [provider]  metFORMIN (GLUCOPHAGE-XR) 500 MG 24 hr tablet Take 500 mg by mouth at bedtime.    Yes [provider]  olmesartan-hydrochlorothiazide (BENICAR HCT) 40-25 MG tablet Take 1 tablet by mouth daily. Must have office visit for further refills. 05/28/23  Yes Chilton Si, MD  triamcinolone ointment (KENALOG) 0.1 % Apply 1 Application topically daily. 06/20/23  Yes [provider]  amLODipine (NORVASC) 5 MG tablet Take 1 tablet (5 mg total) by mouth  daily. 07/30/23 10/28/23  Alver Sorrow, NP  atovaquone-proguanil (MALARONE) 250-100 MG TABS tablet Take 1 tablet by mouth. For travel 07/07/23   [provider]    Family History Family History  Problem Relation Age of Onset   Diabetes Father    Hypertension Father    Cancer Father    Heart disease Father    Cancer Other    Sleep apnea Other     Social History Social History   Tobacco Use   Smoking status: Never   Smokeless tobacco: Never  Vaping Use   Vaping status: Never Used  Substance Use Topics   Alcohol use: Yes    Comment: social   Drug use: No     Allergies   Rosuvastatin   Review of Systems Review of Systems  Constitutional:  Positive for activity change, chills, fatigue and fever.  HENT:  Negative for congestion, ear pain and sore throat.   Eyes:  Negative for pain and visual disturbance.  Respiratory:  Positive for cough and chest tightness. Negative for shortness of breath and wheezing.   Cardiovascular:  Negative for palpitations.  Gastrointestinal:  Negative for abdominal pain and vomiting.  Genitourinary:  Negative for dysuria and hematuria.  Musculoskeletal:  Positive for arthralgias. Negative for back pain.  Skin:  Negative for color change and rash.  Neurological:  Negative for seizures and syncope.  All other systems reviewed and are negative.    Physical Exam Triage Vital Signs ED Triage Vitals  Encounter Vitals Group     BP 08/24/23 1723 (!) 161/84     Systolic BP Percentile --      Diastolic BP Percentile --      Pulse Rate 08/24/23 1723 79     Resp 08/24/23 1723 20     Temp 08/24/23 1723 (!) 101 F (38.3 C)     Temp src --      SpO2 08/24/23 1723 94 %     Weight --      Height --      Head Circumference --      Peak Flow --      Pain Score 08/24/23 1719 3     Pain Loc --      Pain Education --      Exclude from Growth Chart --    No data found.  Updated Vital Signs BP (!) 161/84   Pulse 79   Temp (!) 101 F  (38.3 C)   Resp 20   LMP 08/20/2019 Comment: camila  SpO2 94%   Visual Acuity Right Eye Distance:   Left Eye Distance:   Bilateral Distance:    Right Eye Near:   Left Eye Near:    Bilateral Near:     Physical Exam Vitals and nursing note reviewed.  Constitutional:  General: She is not in acute distress.    Appearance: Normal appearance. She is well-developed.  HENT:     Head: Normocephalic and atraumatic.     Mouth/Throat:     Mouth: Mucous membranes are moist.     Pharynx: No oropharyngeal exudate.  Eyes:     Conjunctiva/sclera: Conjunctivae normal.  Cardiovascular:     Rate and Rhythm: Normal rate and regular rhythm.     Heart sounds: No murmur heard. Pulmonary:     Effort: Pulmonary effort is normal. No respiratory distress.     Breath sounds: No wheezing or rhonchi.     Comments: Questionable decreased breath sounds in the left upper lobe. Abdominal:     Palpations: Abdomen is soft.     Tenderness: There is no abdominal tenderness.  Musculoskeletal:        General: No swelling.     Cervical back: Neck supple.  Skin:    General: Skin is warm and dry.     Capillary Refill: Capillary refill takes less than 2 seconds.  Neurological:     General: No focal deficit present.     Mental Status: She is alert.  Psychiatric:        Mood and Affect: Mood normal.     UC Treatments / Results  Labs (all labs ordered are listed, but only abnormal results are displayed) Labs Reviewed - No data to display  EKG   Radiology No results found.  Procedures Procedures (including critical care time)  Medications Ordered in UC Medications - No data to display  Initial Impression / Assessment and Plan / UC Course  I have reviewed the triage vital signs and the nursing notes.  Pertinent labs & imaging results that were available during my care of the patient were reviewed by me and considered in my medical decision making (see chart for details).  Clinical Course  as of 08/24/23 1903  Fri Aug 24, 2023  1814 DG Chest 2 View [EW]    Clinical Course User Index [EW] Landis Martins, PA-C    Bronchitis: Possibly viral turned bacterial, will start Levaquin 500 mg daily for 10 days, Promethazine DM for cough and low-dose Flexeril to help with muscle spasms in the chest.  COVID test done today and recommend that the patient socially distance as her husband is immunocompromised until results are available.  Follow-up if symptoms fail to improve or worsen. Final Clinical Impressions(s) / UC Diagnoses   Final diagnoses:  None   Discharge Instructions   None    ED Prescriptions   None    PDMP not reviewed this encounter.   Landis Martins, [place] 08/24/23 1904

## 2023-08-25 LAB — SARS CORONAVIRUS 2 (TAT 6-24 HRS): SARS Coronavirus 2: POSITIVE — AB

## 2023-08-26 ENCOUNTER — Other Ambulatory Visit (HOSPITAL_BASED_OUTPATIENT_CLINIC_OR_DEPARTMENT_OTHER): Payer: Self-pay | Admitting: Cardiovascular Disease

## 2023-08-28 MED ORDER — OLMESARTAN MEDOXOMIL-HCTZ 40-25 MG PO TABS: 1.0000 | ORAL_TABLET | Freq: Every day | ORAL | 3 refills | Status: DC

## 2023-10-29 ENCOUNTER — Telehealth: Payer: 59 | Admitting: Emergency Medicine

## 2023-10-29 DIAGNOSIS — B9689 Other specified bacterial agents as the cause of diseases classified elsewhere: Secondary | ICD-10-CM | POA: Diagnosis not present

## 2023-10-29 DIAGNOSIS — J019 Acute sinusitis, unspecified: Secondary | ICD-10-CM | POA: Diagnosis not present

## 2023-10-29 MED ORDER — AMOXICILLIN-POT CLAVULANATE 875-125 MG PO TABS: 1.0000 | ORAL_TABLET | Freq: Two times a day (BID) | ORAL | 0 refills | Status: DC

## 2023-10-29 NOTE — Patient Instructions (Signed)
[name], thank you for joining Cathlyn Parsons, NP for today's virtual visit.  While this provider is not your primary care provider (PCP), if your PCP is located in our provider database this encounter information will be shared with them immediately following your visit.   A [facility] MyChart account gives you access to today's visit and all your visits, tests, and labs performed at [facility] " click here if you don't have a [facility] MyChart account or go to [email or web address]  Consent: (Patient) [name] provided verbal consent for this virtual visit at the beginning of the encounter.  Current Medications:  Current Outpatient Medications:    amoxicillin-clavulanate (AUGMENTIN) 875-125 MG tablet, Take 1 tablet by mouth 2 (two) times daily., Disp: 14 tablet, Rfl: 0   albuterol (VENTOLIN HFA) 108 (90 Base) MCG/ACT inhaler, Inhale 2 puffs into the lungs every 6 (six) hours as needed for wheezing or shortness of breath., Disp: 8 g, Rfl: 0   amLODipine (NORVASC) 5 MG tablet, Take 1 tablet (5 mg total) by mouth daily., Disp: 90 tablet, Rfl: 3   atovaquone-proguanil (MALARONE) 250-100 MG TABS tablet, Take 1 tablet by mouth. For travel, Disp: , Rfl:    benzonatate (TESSALON) 100 MG capsule, Take 1 capsule (100 mg total) by mouth 3 (three) times daily as needed., Disp: 30 capsule, Rfl: 0   cyclobenzaprine (FLEXERIL) 10 MG tablet, Take 0.5 tablets (5 mg total) by mouth 2 (two) times daily as needed for muscle spasms., Disp: 20 tablet, Rfl: 0   estradiol (ESTRACE) 0.1 MG/GM vaginal cream, daily., Disp: , Rfl:    ezetimibe (ZETIA) 10 MG tablet, Take 10 mg by mouth daily. , Disp: , Rfl:    fexofenadine (ALLEGRA ALLERGY) 180 MG tablet, Take 180 mg by mouth daily., Disp: , Rfl:    levofloxacin (LEVAQUIN) 500 MG tablet, Take 1 tablet (500 mg total) by mouth daily., Disp: 10 tablet, Rfl: 0   metFORMIN (GLUCOPHAGE-XR) 500 MG 24 hr tablet, Take 500 mg by mouth at bedtime. , Disp:  , Rfl:    olmesartan-hydrochlorothiazide (BENICAR HCT) 40-25 MG tablet, Take 1 tablet by mouth daily., Disp: 90 tablet, Rfl: 3   promethazine-dextromethorphan (PROMETHAZINE-DM) 6.25-15 MG/5ML syrup, Take 5 mLs by mouth 4 (four) times daily as needed for cough., Disp: 200 mL, Rfl: 0   triamcinolone ointment (KENALOG) 0.1 %, Apply 1 Application topically daily., Disp: , Rfl:    Medications ordered in this encounter:  Meds ordered this encounter  Medications   amoxicillin-clavulanate (AUGMENTIN) 875-125 MG tablet    Sig: Take 1 tablet by mouth 2 (two) times daily.    Dispense:  14 tablet    Refill:  0     *If you need refills on other medications prior to your next appointment, please contact your pharmacy*  Follow-Up: Call back or seek an in-person evaluation if the symptoms worsen or if the condition fails to improve as anticipated.  [facility] [phone]  Other Instructions Consider switching from your saline lavage machine to something like a Neti pot which uses gravity instead of force to irrigate your nose.  Do not pick up the prescription for the antibiotic just yet.  Give it a couple of days and see if you are getting better on your own.  Continue Mucinex DM.  If you are not getting better before you leave for your trip, do pick up the prescription for antibiotics and take them   If you have been instructed  to have an in-person evaluation today at a local Urgent Care facility, please use the link below. It will take you to a list of all of our available [facility], including address, phone number and hours of operation. Please do not delay care.  [facility]  If you or a family member do not have a primary care provider, use the link below to schedule a visit and establish care. When you choose a [facility] primary care physician or advanced practice provider, you gain a long-term partner in health. Find a Primary Care  Provider  Learn more about [facility] in-office and virtual care options: [facility] - Get Care Now

## 2023-10-29 NOTE — Progress Notes (Signed)
Virtual Visit Consent   [name], you are scheduled for a virtual visit with a [facility] provider today. Just as with appointments in the office, your consent must be obtained to participate. Your consent will be active for this visit and any virtual visit you may have with one of our providers in the next 365 days. If you have a MyChart account, a copy of this consent can be sent to you electronically.  As this is a virtual visit, video technology does not allow for your provider to perform a traditional examination. This may limit your provider's ability to fully assess your condition. If your provider identifies any concerns that need to be evaluated in person or the need to arrange testing (such as labs, EKG, etc.), we will make arrangements to do so. Although advances in technology are sophisticated, we cannot ensure that it will always work on either your end or our end. If the connection with a video visit is poor, the visit may have to be switched to a telephone visit. With either a video or telephone visit, we are not always able to ensure that we have a secure connection.  By engaging in this virtual visit, you consent to the provision of healthcare and authorize for your insurance to be billed (if applicable) for the services provided during this visit. Depending on your insurance coverage, you may receive a charge related to this service.  I need to obtain your verbal consent now. Are you willing to proceed with your visit today? [name] has provided verbal consent on 10/29/2023 for a virtual visit (video or telephone). Cathlyn Parsons, NP  Date: 10/29/2023 9:56 AM  Virtual Visit via Video Note   I, Cathlyn Parsons, connected with  [name]  ([id number], Jan 06, 1971) on 10/29/23 at  9:30 AM EST by a video-enabled telemedicine application and verified that I am speaking with the correct person using two identifiers.  Location: Patient: Virtual Visit Location Patient:  Home Provider: Virtual Visit Location Provider: Home Office   I discussed the limitations of evaluation and management by telemedicine and the availability of in person appointments. The patient expressed understanding and agreed to proceed.    History of Present Illness: [name] is a 52 y.o. who identifies as a female who was assigned female at birth, and is being seen today for possible sinus infection.  Symptoms started 2 to 3 days ago.  She does not have a fever.  She does have copious yellow and brown mucus coming out of her nose.  She has been using saline lavage with a machine that uses some force to spray it into her nose to help the discolored congestion drain.  She denies sore throat, she denies postnasal drainage, she denies cough or shortness of breath.  She does have seasonal allergies and is taking Allegra daily, but did not really have significant nasal congestion symptoms until recently.  Does not want to take antibiotics but is worried about upcoming trip to [place].  She is leaving the country this weekend and is concerned that if she does develop a sinus infection that she would not be able to get the treatment she needs over there.  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/28/2019    Allergies:  Allergies  Allergen Reactions   Rosuvastatin     Myalgias   Medications:  Current Outpatient Medications:    amoxicillin-clavulanate (AUGMENTIN) 875-125 MG tablet, Take 1 tablet by mouth 2 (two) times daily., Disp: 14 tablet, Rfl: 0   albuterol (VENTOLIN HFA) 108 (90 Base) MCG/ACT inhaler, Inhale 2 puffs into the lungs every 6 (six) hours as needed for wheezing or shortness of breath., Disp: 8 g, Rfl: 0   amLODipine (NORVASC) 5 MG tablet, Take 1 tablet (5 mg total) by mouth daily., Disp: 90  tablet, Rfl: 3   atovaquone-proguanil (MALARONE) 250-100 MG TABS tablet, Take 1 tablet by mouth. For travel, Disp: , Rfl:    benzonatate (TESSALON) 100 MG capsule, Take 1 capsule (100 mg total) by mouth 3 (three) times daily as needed., Disp: 30 capsule, Rfl: 0   cyclobenzaprine (FLEXERIL) 10 MG tablet, Take 0.5 tablets (5 mg total) by mouth 2 (two) times daily as needed for muscle spasms., Disp: 20 tablet, Rfl: 0   estradiol (ESTRACE) 0.1 MG/GM vaginal cream, daily., Disp: , Rfl:    ezetimibe (ZETIA) 10 MG tablet, Take 10 mg by mouth daily. , Disp: , Rfl:    fexofenadine (ALLEGRA ALLERGY) 180 MG tablet, Take 180 mg by mouth daily., Disp: , Rfl:    levofloxacin (LEVAQUIN) 500 MG tablet, Take 1 tablet (500 mg total) by mouth daily., Disp: 10 tablet, Rfl: 0   metFORMIN (GLUCOPHAGE-XR) 500 MG 24 hr tablet, Take 500 mg by mouth at bedtime. , Disp: , Rfl:    olmesartan-hydrochlorothiazide (BENICAR HCT) 40-25 MG tablet, Take 1 tablet by mouth daily., Disp: 90 tablet, Rfl: 3   promethazine-dextromethorphan (PROMETHAZINE-DM) 6.25-15 MG/5ML syrup, Take 5 mLs by mouth 4 (four) times daily as needed for cough., Disp: 200 mL, Rfl: 0   triamcinolone ointment (KENALOG) 0.1 %, Apply 1 Application topically daily., Disp: , Rfl:   Observations/Objective: Patient is well-developed, well-nourished in no acute distress.  Resting comfortably  at home.  Head is normocephalic, atraumatic.  No labored breathing.  Speech is clear and coherent with logical content.  Patient is alert and oriented at baseline.    Assessment and Plan: 1. Acute bacterial sinusitis  Discussed switching from saline lavage with the machine that forced the saline into her nose to something like a Neti pot which uses gravity for irrigation.  At this point in time, I do not know that she needs antibiotics but respect that she may not be able to get the care that she needs if it does develop into bacterial sinus infection.  Patient will  continue working on managing symptoms and letting the illness run its course, but if she is not getting better she will start taking Augmentin that I prescribed.  If she is getting better, she will not take the Augmentin.  Follow Up Instructions: I discussed the assessment and treatment plan with the patient. The patient was provided an opportunity to ask questions and all were answered. The patient agreed with the plan and demonstrated an understanding of the instructions.  A copy of instructions were sent to the patient via MyChart unless otherwise noted below.   The patient was advised to call back or seek an in-person evaluation if the symptoms worsen or if the condition fails to improve as anticipated.    Cathlyn Parsons, NP

## 2023-12-17 ENCOUNTER — Ambulatory Visit (HOSPITAL_COMMUNITY)
Admission: RE | Admit: 2023-12-17 | Discharge: 2023-12-17 | Disposition: A | Discharge status: Home / Self Care | Payer: 59 | Source: Ambulatory Visit | Attending: Urology | Admitting: Urology

## 2023-12-17 ENCOUNTER — Other Ambulatory Visit (HOSPITAL_COMMUNITY): Payer: Self-pay | Admitting: Urology

## 2023-12-17 DIAGNOSIS — I82220 Acute embolism and thrombosis of inferior vena cava: Secondary | ICD-10-CM | POA: Insufficient documentation

## 2023-12-17 DIAGNOSIS — D49511 Neoplasm of unspecified behavior of right kidney: Secondary | ICD-10-CM | POA: Diagnosis present

## 2024-05-06 ENCOUNTER — Telehealth: Admitting: Physician Assistant

## 2024-05-06 DIAGNOSIS — L299 Pruritus, unspecified: Secondary | ICD-10-CM | POA: Diagnosis not present

## 2024-05-06 DIAGNOSIS — W57XXXA Bitten or stung by nonvenomous insect and other nonvenomous arthropods, initial encounter: Secondary | ICD-10-CM

## 2024-05-06 DIAGNOSIS — S80869A Insect bite (nonvenomous), unspecified lower leg, initial encounter: Secondary | ICD-10-CM

## 2024-05-07 MED ORDER — DOXYCYCLINE HYCLATE 100 MG PO TABS: 200.0000 mg | ORAL_TABLET | Freq: Once | ORAL | 0 refills | Status: AC

## 2024-05-07 NOTE — Progress Notes (Signed)
 I have spent 5 minutes in review of e-visit questionnaire, review and updating patient chart, medical decision making and response to patient.   Piedad Climes, PA-C

## 2024-05-07 NOTE — Progress Notes (Signed)
E-Visit for Tick Bite  Thank you for describing your tick bite, Here is how we plan to help! Based on the information that you shared with me it looks like you have A tick that bite that we will treat with a short course of doxycycline.  In most cases a tick bite is painless and does not itch.  Most tick bites in which the tick is quickly removed do not require prescriptions. Ticks can transmit several diseases if they are infected and remain attacked to your skin. Therefore the length that the tick was attached and any symptoms you have experienced after the bite are import to accurately develop your custom treatment plan. In most cases a single dose of doxycycline may prevent the development of a more serious condition.  Based on your information I have Provided a home care guide for tick bites and  instructions on when to call for help. and I have sent a single dose of doxycycline to the pharmacy you selected. Please make sure that you selected a pharmacy that is open now.  Which ticks  are associated with illness?  The Wood Tick (dog tick) is the size of a watermelon seed and can sometimes transmit Rocky Mountain spotted fever and [place] tick fever.   The Deer Tick (black-legged tick) is between the size of a poppy seed (pin head) and an apple seed, and can sometimes transmit Lyme disease.  A brown to black tick with a white splotch on its back is likely a female Amblyomma americanum ([place] tick). This tick has been associated with Southern Tick Associated illness ( STARI)  Lyme disease has become the most common tick-borne illness in the [place]. The risk of Lyme disease following a recognized deer tick bite is estimated to be 1%.  The majority of cases of Lyme disease start with a bull's eye rash at the site of the tick bite. The rash can occur days to weeks (typically 7-10 days) after a tick bite. Treatment with antibiotics is indicated if this rash appears. Flu-like symptoms  may accompany the rash, including: fever, chills, headaches, muscle aches, and fatigue. Removing ticks promptly may prevent tick borne disease.  What can be used to prevent Tick Bites?  Insect repellant with at leas 20% DEET. Wearing long pants with sock and shoes. Avoiding tall grass and heavily wooded areas. Checking your skin after being outdoors. Shower with a washcloth after outdoor exposures.  HOME CARE ADVICE FOR TICK BITE  Wood Tick Removal:  Use a pair of tweezers and grasp the wood tick close to the skin (on its head). Pull the wood tick straight upward without twisting or crushing it. Maintain a steady pressure until it releases its grip.   If tweezers aren't available, use fingers, a loop of thread around the jaws, or a needle between the jaws for traction.  Note: covering the tick with petroleum jelly, nail polish or rubbing alcohol doesn't work. Neither does touching the tick with a hot or cold object. Tiny Deer Tick Removal:   Needs to be scraped off with a knife blade or credit card edge. Place tick in a sealed container (e.g. glass jar, zip lock plastic bag), in case your doctor wants to see it. Tick's Head Removal:  If the wood tick's head breaks off in the skin, it must be removed. Clean the skin. Then use a sterile needle to uncover the head and lift it out or scrape it off.  If a very small piece   of the head remains, the skin will eventually slough it off. Antibiotic Ointment:  Wash the wound and your hands with soap and water after removal to prevent catching any tick disease.  Apply an over the counter antibiotic ointment (e.g. bacitracin) to the bite once. Expected Course: Tick bites normally don't itch or hurt. That's why they often go unnoticed. Call Your Doctor If:  You can't remove the tick or the tick's head Fever, a severe head ache, or rash occur in the next 2 weeks Bite begins to look infected Lyme's disease is common in your area You have not had a  tetanus in the last 10 years Your current symptoms become worse    MAKE SURE YOU  Understand these instructions. Will watch your condition. Will get help right away if you are not doing well or get worse.    Thank you for choosing an e-visit.  Your e-visit answers were reviewed by a board certified advanced clinical practitioner to complete your personal care plan. Depending upon the condition, your plan could have included both over the counter or prescription medications.  Please review your pharmacy choice. Make sure the pharmacy is open so you can pick up prescription now. If there is a problem, you may contact your provider through MyChart messaging and have the prescription routed to another pharmacy.  Your safety is important to us. If you have drug allergies check your prescription carefully.   For the next 24 hours you can use MyChart to ask questions about today's visit, request a non-urgent call back, or ask for a work or school excuse. You will get an email in the next two days asking about your experience. I hope that your e-visit has been valuable and will speed your recovery.  

## 2024-07-28 ENCOUNTER — Other Ambulatory Visit (HOSPITAL_BASED_OUTPATIENT_CLINIC_OR_DEPARTMENT_OTHER): Payer: Self-pay | Admitting: Cardiovascular Disease

## 2024-09-29 ENCOUNTER — Encounter: Payer: Self-pay | Admitting: *Deleted

## 2024-10-06 ENCOUNTER — Telehealth: Admitting: Family Medicine

## 2024-10-06 DIAGNOSIS — B372 Candidiasis of skin and nail: Secondary | ICD-10-CM

## 2024-10-06 MED ORDER — NYSTATIN-TRIAMCINOLONE 100000-0.1 UNIT/GM-% EX OINT: 1.0000 | TOPICAL_OINTMENT | Freq: Two times a day (BID) | CUTANEOUS | 0 refills | Status: AC

## 2024-10-06 NOTE — Progress Notes (Signed)
 E Visit for Rash  We are sorry that you are not feeling well. Here is how we plan to help!     Based upon your presentation it appears you have a fungal infection.  I have prescribed: and Nystatin cream apply to the affected area twice daily   HOME CARE:  Take cool showers and avoid direct sunlight. Apply cool compress or wet dressings. Take a bath in an oatmeal bath.  Sprinkle content of one Aveeno packet under running faucet with comfortably warm water .  Bathe for 15-20 minutes, 1-2 times daily.  Pat dry with a towel. Do not rub the rash. Use hydrocortisone cream. Take an antihistamine like Benadryl  for widespread rashes that itch.  The adult dose of Benadryl  is 25-50 mg by mouth 4 times daily. Caution:  This type of medication may cause sleepiness.  Do not drink alcohol, drive, or operate dangerous machinery while taking antihistamines.  Do not take these medications if you have prostate enlargement.  Read package instructions thoroughly on all medications that you take.  GET HELP RIGHT AWAY IF:  Symptoms don't go away after treatment. Severe itching that persists. If you rash spreads or swells. If you rash begins to smell. If it blisters and opens or develops a yellow-brown crust. You develop a fever. You have a sore throat. You become short of breath.  MAKE SURE YOU:  Understand these instructions. Will watch your condition. Will get help right away if you are not doing well or get worse.  Thank you for choosing an e-visit. Your e-visit answers were reviewed by a board certified advanced clinical practitioner to complete your personal care plan. Depending upon the condition, your plan could have included both over the counter or prescription medications. Please review your pharmacy choice. Be sure that the pharmacy you have chosen is open so that you can pick up your prescription now.  If there is a problem you may message your provider in MyChart to have the prescription  routed to another pharmacy. Your safety is important to us . If you have drug allergies check your prescription carefully.  For the next 24 hours, you can use MyChart to ask questions about today's visit, request a non-urgent call back, or ask for a work or school excuse from your e-visit provider. You will get an email in the next two days asking about your experience. I hope that your e-visit has been valuable and will speed your recovery.  I have spent 5 minutes in review of e-visit questionnaire, review and updating patient chart, medical decision making and response to patient.   Chiquita CHRISTELLA Barefoot, NP

## 2024-10-13 ENCOUNTER — Encounter (HOSPITAL_BASED_OUTPATIENT_CLINIC_OR_DEPARTMENT_OTHER): Payer: Self-pay

## 2024-10-16 ENCOUNTER — Encounter (HOSPITAL_BASED_OUTPATIENT_CLINIC_OR_DEPARTMENT_OTHER): Payer: Self-pay | Admitting: Cardiovascular Disease

## 2024-10-16 ENCOUNTER — Encounter (HOSPITAL_BASED_OUTPATIENT_CLINIC_OR_DEPARTMENT_OTHER): Payer: Self-pay | Admitting: *Deleted

## 2024-10-16 ENCOUNTER — Ambulatory Visit (HOSPITAL_BASED_OUTPATIENT_CLINIC_OR_DEPARTMENT_OTHER): Admitting: Cardiovascular Disease

## 2024-10-16 VITALS — BP 132/80 | HR 58 | Ht 64.0 in | Wt 177.7 lb

## 2024-10-16 DIAGNOSIS — E7800 Pure hypercholesterolemia, unspecified: Secondary | ICD-10-CM

## 2024-10-16 DIAGNOSIS — Z5181 Encounter for therapeutic drug level monitoring: Secondary | ICD-10-CM

## 2024-10-16 DIAGNOSIS — I10 Essential (primary) hypertension: Secondary | ICD-10-CM

## 2024-10-16 MED ORDER — REPATHA SURECLICK 140 MG/ML ~~LOC~~ SOAJ: 140.0000 mg | SUBCUTANEOUS | 3 refills | Status: AC

## 2024-10-16 NOTE — Progress Notes (Signed)
 Cardiology Office Note:  .   Date:  10/16/2024  ID:  [name], DOB 1971/09/04, MRN [id number] PCP: Julia Velna SAUNDERS, MD  [facility] Providers Cardiologist:  Julia Scarce, MD    History of Present Illness: .    [name] is a 53 y.o. female with a hx of hypertension, OSA on CPAP, anemia, hyperlipidemia, prediabetes, CKD, and kidney cancer, here for follow-up. She was initially seen by Julia Finder, NP on 06/22/2022 in the [facility]. At her initial visit her blood pressure was 146/100. HCTZ was stopped and she was started on olmesartan /HCTZ. She reported myalgias on atorvastatin and held her statin for 2 weeks. Rosuvastatin caused headaches.  Lpa was elevated and a coronary calcium score was recommended. Her calcium score 06/2022 was 0. She followed up with our pharmacist and enrolled in the remote patient monitoring study. Olmesartan /HCTZ was increased and blood pressure was 120/75 at follow-up. She last saw [name] 08/2022 and blood pressure was 138/82 but had been better controlled at home.    At her visit 09/2022, home blood pressures were generally well controlled, gradually increasing to the 130s systolic throughout the day. Planned to keep working on her diet and exercise.  She continued olmesartan  and HCTZ.  At her visit 06/2023 blood pressure was averaging in the 120s over 70s.  Plans were made to repeat her calcium score in 2026.   Discussed the use of [name] scribe software for clinical note transcription with the patient, who gave verbal consent to proceed.  History of Present Illness Ms. [name] has been monitoring her blood pressure more frequently since the past weekend, noting systolic readings typically around 130 or slightly below, with diastolic readings in the 80s or below. Previously, her blood pressure was over 130, but she feels it has improved since starting CPAP therapy for sleep apnea, diagnosed following a sleep study in March 2025. She feels  better during the day since starting CPAP therapy.  She has a history of high cholesterol, first noted at age 69, and is currently on Zetia , but her cholesterol levels remain high. She has tried multiple statins in the past, including atorvastatin and rosuvastatin, but experienced adverse effects such as headaches and muscle aches. Her family history includes high cholesterol and heart issues on her father's side, with her father having had heart issues and stents, and her grandmother having had a pacemaker.  Her current medications include olmesartan  and hydrochlorothiazide  for blood pressure, and Zetia  for cholesterol. She is not taking amlodipine , which was previously prescribed, and has not taken it for the past year. Her blood pressure readings at home range from 127 to 131 systolic and 78 to 85 diastolic.  She exercises daily, focusing on strength training and mobility, and tries to move for at least 30 minutes each day. She follows a pescatarian diet, avoiding meat but consuming fish, and acknowledges that she may consume more carbohydrates than ideal. She uses a Peloton for exercise and tracks her heart rate, noting it reaches 127 to 130 during cardio sessions.  ROS:  As per HPI  Studies Reviewed: [name]   EKG Interpretation Date/Time:  Thursday October 16 2024 09:17:28 EDT Ventricular Rate:  64 PR Interval:  146 QRS Duration:  72 QT Interval:  410 QTC Calculation: 422 R Axis:   8  Text Interpretation: Normal sinus rhythm with sinus arrhythmia When compared with ECG of 12-Jul-2023 08:20, Nonspecific T wave abnormality now evident in Lateral leads Confirmed by Fox Julia ([id number])  on 10/16/2024 9:37:00 AM     Risk Assessment/Calculations:             Physical Exam:   VS:  BP 136/88   Pulse (!) 58   Ht 5' 4 (1.626 m)   Wt 177 lb 11.2 oz (80.6 kg)   LMP 08/20/2019 Comment: [name]  SpO2 99%   BMI 30.50 kg/m  , BMI Body mass index is 30.5 kg/m. GENERAL:  Well  appearing HEENT: Pupils equal round and reactive, fundi not visualized, oral mucosa unremarkable NECK:  No jugular venous distention, waveform within normal limits, carotid upstroke brisk and symmetric, no bruits, no thyromegaly LUNGS:  Clear to auscultation bilaterally HEART:  RRR.  PMI not displaced or sustained,S1 and S2 within normal limits, no S3, no S4, no clicks, no rubs, no murmurs ABD:  Flat, positive bowel sounds normal in frequency in pitch, no bruits, no rebound, no guarding, no midline pulsatile mass, no hepatomegaly, no splenomegaly EXT:  2 plus pulses throughout, no edema, no cyanosis no clubbing SKIN:  No rashes no nodules NEURO:  Cranial nerves II through XII grossly intact, motor grossly intact throughout PSYCH:  Cognitively intact, oriented to person place and time   ASSESSMENT AND PLAN: .    Assessment & Plan # Hypercholesterolemia with elevated lipoprotein(a) Cholesterol levels remain high despite Zetia . Lipoprotein(a) significantly elevated at 223 mg/dL, indicating higher cardiovascular risk. Statins poorly tolerated due to headaches and muscle aches. - Start Repatha injections every two weeks after her upcoming trip. - Recheck lipid panel in a few months to assess response to Repatha.  # Aortic atherosclerosis Previous CT scan in 2021 showed scattered aortic atherosclerosis with calcifications, indicating risk for smaller arteries. Supports need for aggressive management of hypercholesterolemia and hypertension.  # Essential hypertension Blood pressure reasonably controlled with olmesartan  and hydrochlorothiazide . Not taking amlodipine , but blood pressure remains controlled. - Encourage increasing cardiovascular exercise to help maintain blood pressure control. - Advise on strict sodium restriction in diet. - Monitor blood pressure regularly to ensure it remains under 130/80 mmHg.  # Obstructive sleep apnea Using CPAP therapy since March with significant  improvement in daytime alertness and possibly improved blood pressure control.  # Follow-up Monitor effectiveness of new treatment regimen and ensure continued control of blood pressure and cholesterol levels. - Schedule follow-up appointment in four months. - Recheck labs in a few months to evaluate the effect of Repatha on cholesterol levels.       Dispo: f/u in 4 months  Signed, Julia Scarce, MD

## 2024-10-16 NOTE — Patient Instructions (Signed)
 Medication Instructions:  START REPATHA   STOP ZETIA  ONCE YOU START REPATHA   *If you need a refill on your cardiac medications before your next appointment, please call your pharmacy*  Lab Work: FASTING LP/CMET IN 2 TO 3 MONTHS   If you have labs (blood work) drawn today and your tests are completely normal, you will receive your results only by: MyChart Message (if you have MyChart) OR A paper copy in the mail If you have any lab test that is abnormal or we need to change your treatment, we will call you to review the results.  Testing/Procedures: NONE  Follow-Up: At [facility], you and your health needs are our priority.  As part of our continuing mission to provide you with exceptional heart care, our providers are all part of one team.  This team includes your primary Cardiologist (physician) and Advanced Practice Providers or APPs (Physician Assistants and Nurse Practitioners) who all work together to provide you with the care you need, when you need it.  Your next appointment:   4 month(s)  Provider:   Annabella Scarce, MD, Rosaline Bane, NP, or Reche Finder, NP     We recommend signing up for the patient portal called MyChart.  Sign up information is provided on this After Visit Summary.  MyChart is used to connect with patients for Virtual Visits (Telemedicine).  Patients are able to view lab/test results, encounter notes, upcoming appointments, etc.  Non-urgent messages can be sent to your provider as well.   To learn more about what you can do with MyChart, go to [email or web address].

## 2024-10-28 ENCOUNTER — Other Ambulatory Visit (HOSPITAL_BASED_OUTPATIENT_CLINIC_OR_DEPARTMENT_OTHER): Payer: Self-pay | Admitting: Cardiovascular Disease

## 2024-10-28 ENCOUNTER — Encounter (HOSPITAL_BASED_OUTPATIENT_CLINIC_OR_DEPARTMENT_OTHER): Payer: Self-pay | Admitting: Cardiovascular Disease

## 2024-12-02 ENCOUNTER — Other Ambulatory Visit (HOSPITAL_COMMUNITY): Payer: Self-pay | Admitting: Urology

## 2024-12-02 ENCOUNTER — Ambulatory Visit (HOSPITAL_COMMUNITY)
Admission: RE | Admit: 2024-12-02 | Discharge: 2024-12-02 | Disposition: A | Source: Ambulatory Visit | Attending: Urology

## 2024-12-02 DIAGNOSIS — D49511 Neoplasm of unspecified behavior of right kidney: Secondary | ICD-10-CM

## 2025-02-20 ENCOUNTER — Ambulatory Visit (HOSPITAL_BASED_OUTPATIENT_CLINIC_OR_DEPARTMENT_OTHER): Admitting: Cardiovascular Disease
